# Patient Record
Sex: Female | Born: 1980 | Race: Black or African American | Hispanic: No | Marital: Married | Smoking: Former smoker
Health system: Southern US, Community
[De-identification: ages and names within clinical notes are randomized; demographics above are authoritative.]

## PROBLEM LIST (undated history)

## (undated) DIAGNOSIS — N979 Female infertility, unspecified: Secondary | ICD-10-CM

## (undated) DIAGNOSIS — N736 Female pelvic peritoneal adhesions (postinfective): Secondary | ICD-10-CM

## (undated) DIAGNOSIS — E7800 Pure hypercholesterolemia, unspecified: Secondary | ICD-10-CM

## (undated) DIAGNOSIS — Z8759 Personal history of other complications of pregnancy, childbirth and the puerperium: Secondary | ICD-10-CM

## (undated) DIAGNOSIS — D649 Anemia, unspecified: Secondary | ICD-10-CM

## (undated) DIAGNOSIS — N7011 Chronic salpingitis: Secondary | ICD-10-CM

## (undated) DIAGNOSIS — O09813 Supervision of pregnancy resulting from assisted reproductive technology, third trimester: Secondary | ICD-10-CM

## (undated) DIAGNOSIS — Z973 Presence of spectacles and contact lenses: Secondary | ICD-10-CM

## (undated) DIAGNOSIS — M549 Dorsalgia, unspecified: Secondary | ICD-10-CM

## (undated) HISTORY — DX: Pure hypercholesterolemia, unspecified: E78.00

## (undated) HISTORY — DX: Anemia, unspecified: D64.9

## (undated) HISTORY — DX: Dorsalgia, unspecified: M54.9

## (undated) HISTORY — DX: Female infertility, unspecified: N97.9

---

## 2011-01-22 ENCOUNTER — Emergency Department (HOSPITAL_COMMUNITY): Payer: No Typology Code available for payment source

## 2011-01-22 ENCOUNTER — Emergency Department (HOSPITAL_COMMUNITY)
Admission: EM | Admit: 2011-01-22 | Discharge: 2011-01-22 | Disposition: A | Discharge status: Home / Self Care | Payer: No Typology Code available for payment source | Attending: Emergency Medicine | Admitting: Emergency Medicine

## 2011-01-22 DIAGNOSIS — IMO0001 Reserved for inherently not codable concepts without codable children: Secondary | ICD-10-CM | POA: Insufficient documentation

## 2011-01-22 DIAGNOSIS — R079 Chest pain, unspecified: Secondary | ICD-10-CM | POA: Insufficient documentation

## 2012-06-12 ENCOUNTER — Emergency Department (HOSPITAL_COMMUNITY)
Admission: EM | Admit: 2012-06-12 | Discharge: 2012-06-12 | Disposition: A | Discharge status: Home / Self Care | Payer: No Typology Code available for payment source | Attending: Emergency Medicine | Admitting: Emergency Medicine

## 2012-06-12 ENCOUNTER — Encounter (HOSPITAL_COMMUNITY): Payer: Self-pay | Admitting: *Deleted

## 2012-06-12 DIAGNOSIS — T148XXA Other injury of unspecified body region, initial encounter: Secondary | ICD-10-CM

## 2012-06-12 DIAGNOSIS — Y92410 Unspecified street and highway as the place of occurrence of the external cause: Secondary | ICD-10-CM | POA: Insufficient documentation

## 2012-06-12 DIAGNOSIS — F17200 Nicotine dependence, unspecified, uncomplicated: Secondary | ICD-10-CM | POA: Insufficient documentation

## 2012-06-12 DIAGNOSIS — S335XXA Sprain of ligaments of lumbar spine, initial encounter: Secondary | ICD-10-CM | POA: Insufficient documentation

## 2012-06-12 MED ORDER — CYCLOBENZAPRINE HCL 10 MG PO TABS: 10.0000 mg | ORAL_TABLET | Freq: Three times a day (TID) | ORAL | Status: DC | PRN

## 2012-06-12 MED ORDER — HYDROCODONE-ACETAMINOPHEN 5-325 MG PO TABS
1.0000 | ORAL_TABLET | Freq: Once | ORAL | Status: AC
  Administered 2012-06-12: 1 via ORAL
  Filled 2012-06-12: qty 1

## 2012-06-12 MED ORDER — IBUPROFEN 800 MG PO TABS: 800.0000 mg | ORAL_TABLET | Freq: Three times a day (TID) | ORAL | Status: DC | PRN
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
## 2012-06-12 NOTE — ED Provider Notes (Signed)
Medical screening examination/treatment/procedure(s) were performed by non-physician practitioner and as supervising physician I was immediately available for consultation/collaboration.   Gwyneth Sprout, MD 06/12/12 2355
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
## 2015-09-10 DIAGNOSIS — O00202 Left ovarian pregnancy without intrauterine pregnancy: Secondary | ICD-10-CM | POA: Insufficient documentation

## 2015-09-20 ENCOUNTER — Emergency Department (HOSPITAL_COMMUNITY): Payer: BLUE CROSS/BLUE SHIELD | Admitting: Anesthesiology

## 2015-09-20 ENCOUNTER — Ambulatory Visit (HOSPITAL_COMMUNITY)
Admission: EM | Admit: 2015-09-20 | Discharge: 2015-09-20 | Disposition: A | Discharge status: Home / Self Care | Payer: BLUE CROSS/BLUE SHIELD | Source: Ambulatory Visit | Attending: Obstetrics & Gynecology | Admitting: Obstetrics & Gynecology

## 2015-09-20 ENCOUNTER — Encounter (HOSPITAL_COMMUNITY): Payer: Self-pay

## 2015-09-20 ENCOUNTER — Other Ambulatory Visit (HOSPITAL_COMMUNITY): Payer: Self-pay | Admitting: Obstetrics & Gynecology

## 2015-09-20 ENCOUNTER — Emergency Department (HOSPITAL_COMMUNITY): Payer: BLUE CROSS/BLUE SHIELD

## 2015-09-20 ENCOUNTER — Encounter (HOSPITAL_COMMUNITY)
Admission: EM | Disposition: A | Discharge status: Home / Self Care | Payer: Self-pay | Source: Ambulatory Visit | Attending: Emergency Medicine

## 2015-09-20 DIAGNOSIS — O001 Tubal pregnancy without intrauterine pregnancy: Secondary | ICD-10-CM | POA: Insufficient documentation

## 2015-09-20 DIAGNOSIS — O26899 Other specified pregnancy related conditions, unspecified trimester: Secondary | ICD-10-CM

## 2015-09-20 DIAGNOSIS — O009 Unspecified ectopic pregnancy without intrauterine pregnancy: Secondary | ICD-10-CM

## 2015-09-20 DIAGNOSIS — Z87891 Personal history of nicotine dependence: Secondary | ICD-10-CM | POA: Insufficient documentation

## 2015-09-20 DIAGNOSIS — R102 Pelvic and perineal pain: Secondary | ICD-10-CM

## 2015-09-20 HISTORY — PX: UNILATERAL SALPINGECTOMY: SHX6160

## 2015-09-20 HISTORY — PX: LAPAROSCOPY: SHX197

## 2015-09-20 LAB — HIV ANTIBODY (ROUTINE TESTING W REFLEX): HIV Screen 4th Generation wRfx: NONREACTIVE

## 2015-09-20 LAB — URINALYSIS, ROUTINE W REFLEX MICROSCOPIC
BILIRUBIN URINE: NEGATIVE
Glucose, UA: NEGATIVE mg/dL
HGB URINE DIPSTICK: NEGATIVE
Ketones, ur: 15 mg/dL — AB
Leukocytes, UA: NEGATIVE
Nitrite: NEGATIVE
PROTEIN: NEGATIVE mg/dL
Specific Gravity, Urine: 1.031 — ABNORMAL HIGH (ref 1.005–1.030)
pH: 6 (ref 5.0–8.0)

## 2015-09-20 LAB — HCG, QUANTITATIVE, PREGNANCY: HCG, BETA CHAIN, QUANT, S: 8088 m[IU]/mL — AB (ref <5)

## 2015-09-20 LAB — WET PREP, GENITAL
Clue Cells Wet Prep HPF POC: NONE SEEN
Sperm: NONE SEEN
Trich, Wet Prep: NONE SEEN
YEAST WET PREP: NONE SEEN

## 2015-09-20 LAB — TYPE AND SCREEN
ABO/RH(D): A POS
Antibody Screen: NEGATIVE

## 2015-09-20 LAB — CBC
HEMATOCRIT: 33.4 % — AB (ref 36.0–46.0)
HEMOGLOBIN: 11.2 g/dL — AB (ref 12.0–15.0)
MCH: 30.8 pg (ref 26.0–34.0)
MCHC: 33.5 g/dL (ref 30.0–36.0)
MCV: 91.8 fL (ref 78.0–100.0)
Platelets: 238 10*3/uL (ref 150–400)
RBC: 3.64 MIL/uL — AB (ref 3.87–5.11)
RDW: 12.5 % (ref 11.5–15.5)
WBC: 8.7 10*3/uL (ref 4.0–10.5)

## 2015-09-20 LAB — BASIC METABOLIC PANEL
Anion gap: 7 (ref 5–15)
BUN: 13 mg/dL (ref 6–20)
CHLORIDE: 106 mmol/L (ref 101–111)
CO2: 23 mmol/L (ref 22–32)
Calcium: 9.2 mg/dL (ref 8.9–10.3)
Creatinine, Ser: 0.79 mg/dL (ref 0.44–1.00)
GFR calc Af Amer: >60 mL/min (ref >60)
GFR calc non Af Amer: >60 mL/min (ref >60)
GLUCOSE: 100 mg/dL — AB (ref 65–99)
POTASSIUM: 3.9 mmol/L (ref 3.5–5.1)
Sodium: 136 mmol/L (ref 135–145)

## 2015-09-20 LAB — ABO/RH
ABO/RH(D): A POS
ABO/RH(D): A POS

## 2015-09-20 SURGERY — LAPAROSCOPY, DIAGNOSTIC
Anesthesia: General

## 2015-09-20 MED ORDER — SODIUM CHLORIDE 0.9 % IV BOLUS (SEPSIS)
1000.0000 mL | Freq: Once | INTRAVENOUS | Status: AC
  Administered 2015-09-20: 1000 mL via INTRAVENOUS

## 2015-09-20 MED ORDER — SCOPOLAMINE 1 MG/3DAYS TD PT72
MEDICATED_PATCH | TRANSDERMAL | Status: AC
  Filled 2015-09-20: qty 1

## 2015-09-20 MED ORDER — ACETAMINOPHEN 500 MG PO TABS
1000.0000 mg | ORAL_TABLET | Freq: Once | ORAL | Status: AC
  Administered 2015-09-20: 1000 mg via ORAL
  Filled 2015-09-20: qty 2

## 2015-09-20 MED ORDER — LIDOCAINE HCL (CARDIAC) 20 MG/ML IV SOLN
INTRAVENOUS | Status: DC | PRN
  Administered 2015-09-20: 100 mg via INTRAVENOUS
  Administered 2015-09-20: 400 mg via INTRAVENOUS

## 2015-09-20 MED ORDER — PHENYLEPHRINE HCL 10 MG/ML IJ SOLN
INTRAMUSCULAR | Status: DC | PRN
  Administered 2015-09-20 (×2): 40 ug via INTRAVENOUS
  Administered 2015-09-20: 120 ug via INTRAVENOUS

## 2015-09-20 MED ORDER — LACTATED RINGERS IV SOLN
INTRAVENOUS | Status: DC
  Administered 2015-09-20 (×2): via INTRAVENOUS

## 2015-09-20 MED ORDER — LIDOCAINE HCL (CARDIAC) 20 MG/ML IV SOLN
INTRAVENOUS | Status: AC
  Filled 2015-09-20: qty 5

## 2015-09-20 MED ORDER — ONDANSETRON HCL 4 MG/2ML IJ SOLN
INTRAMUSCULAR | Status: AC
  Filled 2015-09-20: qty 2

## 2015-09-20 MED ORDER — MIDAZOLAM HCL 2 MG/2ML IJ SOLN
INTRAMUSCULAR | Status: AC
  Filled 2015-09-20: qty 2

## 2015-09-20 MED ORDER — ONDANSETRON 4 MG PO TBDP
4.0000 mg | ORAL_TABLET | Freq: Once | ORAL | Status: AC
  Administered 2015-09-20: 4 mg via ORAL
  Filled 2015-09-20: qty 1

## 2015-09-20 MED ORDER — MORPHINE SULFATE (PF) 4 MG/ML IV SOLN: 4.0000 mg | Freq: Once | INTRAVENOUS | Status: DC

## 2015-09-20 MED ORDER — PHENYLEPHRINE 40 MCG/ML (10ML) SYRINGE FOR IV PUSH (FOR BLOOD PRESSURE SUPPORT)
PREFILLED_SYRINGE | INTRAVENOUS | Status: AC
  Filled 2015-09-20: qty 10

## 2015-09-20 MED ORDER — FENTANYL CITRATE (PF) 100 MCG/2ML IJ SOLN
INTRAMUSCULAR | Status: AC
  Filled 2015-09-20: qty 2

## 2015-09-20 MED ORDER — CEFAZOLIN SODIUM-DEXTROSE 2-3 GM-% IV SOLR
2.0000 g | INTRAVENOUS | Status: AC
  Administered 2015-09-20: 2 g via INTRAVENOUS

## 2015-09-20 MED ORDER — ONDANSETRON HCL 4 MG/2ML IJ SOLN: 4.0000 mg | Freq: Once | INTRAMUSCULAR | Status: DC | PRN

## 2015-09-20 MED ORDER — MIDAZOLAM HCL 2 MG/2ML IJ SOLN
INTRAMUSCULAR | Status: DC | PRN
Start: 2015-09-20 — End: 2015-09-20
  Administered 2015-09-20: 2 mg via INTRAVENOUS

## 2015-09-20 MED ORDER — MORPHINE SULFATE (PF) 4 MG/ML IV SOLN
4.0000 mg | Freq: Once | INTRAVENOUS | Status: AC
  Administered 2015-09-20: 4 mg via INTRAMUSCULAR
  Filled 2015-09-20: qty 1

## 2015-09-20 MED ORDER — BUPIVACAINE HCL (PF) 0.25 % IJ SOLN
INTRAMUSCULAR | Status: DC | PRN
  Administered 2015-09-20: 30 mL

## 2015-09-20 MED ORDER — DEXAMETHASONE SODIUM PHOSPHATE 10 MG/ML IJ SOLN
INTRAMUSCULAR | Status: AC
  Filled 2015-09-20: qty 1

## 2015-09-20 MED ORDER — FENTANYL CITRATE (PF) 100 MCG/2ML IJ SOLN
25.0000 ug | INTRAMUSCULAR | Status: DC | PRN
  Administered 2015-09-20: 50 ug via INTRAVENOUS

## 2015-09-20 MED ORDER — KETOROLAC TROMETHAMINE 30 MG/ML IJ SOLN: 30.0000 mg | Freq: Once | INTRAMUSCULAR | Status: DC

## 2015-09-20 MED ORDER — PROPOFOL 10 MG/ML IV BOLUS
INTRAVENOUS | Status: AC
  Filled 2015-09-20: qty 20

## 2015-09-20 MED ORDER — KETOROLAC TROMETHAMINE 30 MG/ML IJ SOLN
INTRAMUSCULAR | Status: DC | PRN
  Administered 2015-09-20: 30 mg via INTRAVENOUS

## 2015-09-20 MED ORDER — IBUPROFEN 600 MG PO TABS
600.0000 mg | ORAL_TABLET | Freq: Four times a day (QID) | ORAL | Status: DC | PRN
Start: 2015-09-20 — End: 2016-01-12

## 2015-09-20 MED ORDER — NEOSTIGMINE METHYLSULFATE 10 MG/10ML IV SOLN
INTRAVENOUS | Status: DC | PRN
  Administered 2015-09-20: 4 mg via INTRAVENOUS

## 2015-09-20 MED ORDER — GLYCOPYRROLATE 0.2 MG/ML IJ SOLN
INTRAMUSCULAR | Status: AC
  Filled 2015-09-20: qty 3

## 2015-09-20 MED ORDER — KETOROLAC TROMETHAMINE 30 MG/ML IJ SOLN
INTRAMUSCULAR | Status: AC
  Filled 2015-09-20: qty 1

## 2015-09-20 MED ORDER — LACTATED RINGERS IR SOLN
Status: DC | PRN
  Administered 2015-09-20: 3000 mL

## 2015-09-20 MED ORDER — NEOSTIGMINE METHYLSULFATE 10 MG/10ML IV SOLN
INTRAVENOUS | Status: AC
  Filled 2015-09-20: qty 1

## 2015-09-20 MED ORDER — OXYCODONE-ACETAMINOPHEN 5-325 MG PO TABS: 1.0000 | ORAL_TABLET | Freq: Four times a day (QID) | ORAL | Status: DC | PRN

## 2015-09-20 MED ORDER — PROPOFOL 10 MG/ML IV BOLUS
INTRAVENOUS | Status: DC | PRN
  Administered 2015-09-20: 180 mg via INTRAVENOUS

## 2015-09-20 MED ORDER — SCOPOLAMINE 1 MG/3DAYS TD PT72
1.0000 | MEDICATED_PATCH | Freq: Once | TRANSDERMAL | Status: DC
  Administered 2015-09-20: 1.5 mg via TRANSDERMAL

## 2015-09-20 MED ORDER — CEFAZOLIN SODIUM-DEXTROSE 2-3 GM-% IV SOLR
INTRAVENOUS | Status: AC
  Filled 2015-09-20: qty 50

## 2015-09-20 MED ORDER — GLYCOPYRROLATE 0.2 MG/ML IJ SOLN
INTRAMUSCULAR | Status: DC | PRN
  Administered 2015-09-20: 0.6 mg via INTRAVENOUS

## 2015-09-20 MED ORDER — MORPHINE SULFATE (PF) 4 MG/ML IV SOLN
4.0000 mg | Freq: Once | INTRAVENOUS | Status: AC
  Administered 2015-09-20: 4 mg via INTRAVENOUS
  Filled 2015-09-20: qty 1

## 2015-09-20 MED ORDER — FENTANYL CITRATE (PF) 250 MCG/5ML IJ SOLN
INTRAMUSCULAR | Status: AC
  Filled 2015-09-20: qty 5

## 2015-09-20 MED ORDER — FENTANYL CITRATE (PF) 100 MCG/2ML IJ SOLN
INTRAMUSCULAR | Status: DC | PRN
  Administered 2015-09-20 (×5): 50 ug via INTRAVENOUS

## 2015-09-20 MED ORDER — ROCURONIUM BROMIDE 100 MG/10ML IV SOLN
INTRAVENOUS | Status: DC | PRN
  Administered 2015-09-20: 40 mg via INTRAVENOUS

## 2015-09-20 MED ORDER — DEXAMETHASONE SODIUM PHOSPHATE 10 MG/ML IJ SOLN
INTRAMUSCULAR | Status: DC | PRN
  Administered 2015-09-20: 4 mg via INTRAVENOUS

## 2015-09-20 MED ORDER — MEPERIDINE HCL 25 MG/ML IJ SOLN: 6.2500 mg | INTRAMUSCULAR | Status: DC | PRN

## 2015-09-20 MED ORDER — HYDROCODONE-ACETAMINOPHEN 7.5-325 MG PO TABS: 1.0000 | ORAL_TABLET | Freq: Once | ORAL | Status: DC | PRN

## 2015-09-20 MED ORDER — ONDANSETRON HCL 4 MG/2ML IJ SOLN
INTRAMUSCULAR | Status: DC | PRN
  Administered 2015-09-20: 4 mg via INTRAVENOUS

## 2015-09-20 MED ORDER — BUPIVACAINE HCL (PF) 0.25 % IJ SOLN
INTRAMUSCULAR | Status: AC
  Filled 2015-09-20: qty 30

## 2015-09-20 SURGICAL SUPPLY — 34 items
CABLE HIGH FREQUENCY MONO STRZ (ELECTRODE) IMPLANT
CATH ROBINSON RED A/P 16FR (CATHETERS) IMPLANT
CHLORAPREP W/TINT 26ML (MISCELLANEOUS) ×4 IMPLANT
CLOTH BEACON ORANGE TIMEOUT ST (SAFETY) ×4 IMPLANT
DRSG COVADERM PLUS 2X2 (GAUZE/BANDAGES/DRESSINGS) ×8 IMPLANT
DRSG OPSITE POSTOP 3X4 (GAUZE/BANDAGES/DRESSINGS) ×4 IMPLANT
EVACUATOR SMOKE 8.L (FILTER) ×4 IMPLANT
FORCEPS CUTTING 33CM 5MM (CUTTING FORCEPS) IMPLANT
FORCEPS CUTTING 45CM 5MM (CUTTING FORCEPS) IMPLANT
GLOVE BIO SURGEON STRL SZ7 (GLOVE) ×4 IMPLANT
GLOVE BIOGEL PI IND STRL 7.0 (GLOVE) ×4 IMPLANT
GLOVE BIOGEL PI INDICATOR 7.0 (GLOVE) ×4
GOWN STRL REUS W/TWL LRG LVL3 (GOWN DISPOSABLE) ×12 IMPLANT
LIQUID BAND (GAUZE/BANDAGES/DRESSINGS) ×4 IMPLANT
MANIPULATOR UTERINE 4.5 ZUMI (MISCELLANEOUS) ×4 IMPLANT
NEEDLE INSUFFLATION 120MM (ENDOMECHANICALS) IMPLANT
NS IRRIG 1000ML POUR BTL (IV SOLUTION) ×4 IMPLANT
PACK LAPAROSCOPY BASIN (CUSTOM PROCEDURE TRAY) ×4 IMPLANT
PAD TRENDELENBURG POSITION (MISCELLANEOUS) ×4 IMPLANT
POUCH SPECIMEN RETRIEVAL 10MM (ENDOMECHANICALS) ×4 IMPLANT
PROTECTOR NERVE ULNAR (MISCELLANEOUS) ×4 IMPLANT
SCISSORS LAP 5X35 DISP (ENDOMECHANICALS) IMPLANT
SET IRRIG TUBING LAPAROSCOPIC (IRRIGATION / IRRIGATOR) ×4 IMPLANT
SLEEVE XCEL OPT CAN 5 100 (ENDOMECHANICALS) ×8 IMPLANT
SOLUTION ELECTROLUBE (MISCELLANEOUS) IMPLANT
SUT VICRYL 0 UR6 27IN ABS (SUTURE) ×4 IMPLANT
SUT VICRYL 4-0 PS2 18IN ABS (SUTURE) ×4 IMPLANT
TOWEL OR 17X24 6PK STRL BLUE (TOWEL DISPOSABLE) ×8 IMPLANT
TROCAR BALLN 12MMX100 BLUNT (TROCAR) ×4 IMPLANT
TROCAR OPTI TIP 5M 100M (ENDOMECHANICALS) ×4 IMPLANT
TROCAR XCEL NON-BLD 11X100MML (ENDOMECHANICALS) IMPLANT
TROCAR XCEL NON-BLD 5MMX100MML (ENDOMECHANICALS) ×4 IMPLANT
WARMER LAPAROSCOPE (MISCELLANEOUS) ×4 IMPLANT
WATER STERILE IRR 1000ML POUR (IV SOLUTION) IMPLANT

## 2015-09-20 NOTE — ED Notes (Addendum)
Pt 2 months pregnant and woke up this morning at 0400 cramping on the left lower side radiating around to the back. Also reports spotting off and on for about a week. Reports the pain is constant

## 2015-09-20 NOTE — ED Notes (Signed)
Report given to Nicole with [facility] ?

## 2015-09-20 NOTE — ED Notes (Signed)
Reports lower left quadrant pain with spotting for a week, "i think i'm 2 months pregnant". Previous pregnancy had no complications.

## 2015-09-20 NOTE — ED Notes (Signed)
Patient transported to Ultrasound 

## 2015-09-20 NOTE — Discharge Instructions (Addendum)
Ruptured Ectopic Pregnancy °An ectopic pregnancy is when the fertilized egg attaches (implants) outside the uterus. Most ectopic pregnancies occur in the fallopian tube. Rarely do ectopic pregnancies occur on the ovary, intestine, pelvis, or cervix. An ectopic pregnancy does not have the ability to develop into a normal, healthy baby.  °A ruptured ectopic pregnancy is one in which the fallopian tube gets torn or bursts and results in internal bleeding. Often there is intense abdominal pain, and sometimes, vaginal bleeding. Having an ectopic pregnancy can be a life-threatening experience. If left untreated, this dangerous condition can lead to a blood transfusion, abdominal surgery, or even death.  °CAUSES  °Damage to the fallopian tubes is the suspected cause in most ectopic pregnancies.  °RISK FACTORS °Depending on your circumstances, the amount of risk of having an ectopic pregnancy will vary. There are 3 categories that may help you identify whether you are potentially at risk. °High Risk °· You have gone through infertility treatment. °· You have had a previous ectopic pregnancy. °· You have had previous tubal surgery. °· You have had previous surgery to have the fallopian tubes tied (tubal ligation). °· You have tubal problems or diseases. °· You have been exposed to DES. DES is a medicine that was used until 1971 and had effects on babies whose mothers took the medicine. °· You become pregnant while using an intrauterine device (IUD) for birth control.  °Moderate Risk °· You have a history of infertility. °· You have a history of a sexually transmitted infection (STI). °· You have a history of pelvic inflammatory disease (PID). °· You have scarring from endometriosis. °· You have multiple sexual partners. °· You smoke.  °Low Risk °· You have had previous pelvic surgery. °· You use vaginal douching. °· You became sexually active before 35 years of age. °SYMPTOMS °An ectopic pregnancy should be suspected in  anyone who has missed a period and has abdominal pain or bleeding. °· You may experience normal pregnancy symptoms, such as: °¨ Nausea. °¨ Tiredness. °¨ Breast tenderness. °· Symptoms that are not normal include: °¨ Pain with intercourse. °¨ Irregular vaginal bleeding or spotting. °¨ Cramping or pain on one side, or in the lower abdomen. °¨ Fast heartbeat. °¨ Passing out while having a bowel movement. °· Symptoms of a ruptured ectopic pregnancy and internal bleeding may include: °¨ Sudden, severe pain in the abdomen and pelvis. °¨ Dizziness or fainting. °¨ Pain in the shoulder area. °DIAGNOSIS  °Tests that may be performed include: °· A pregnancy test. °· An ultrasound. °· Testing the specific level of pregnancy hormone in the bloodstream. °· Taking a sample of uterus tissue (dilation and curettage, D&C). °· Surgery to perform a visual exam of the inside of the abdomen using a lighted tube (laparoscopy). °TREATMENT  °Laparoscopic surgery or abdominal surgery is recommended for a ruptured ectopic pregnancy.  °· The whole fallopian tube may need to be removed (salpingectomy). °· If the tube is not too damaged, the tube may be saved, and the pregnancy will be surgically removed. In time, the tube may still function. °· If you have lost a lot of blood, you may need a blood transfusion. °· You may receive a Rho (D) immune globulin shot if you are Rh negative and the father is Rh positive, or if you do not know the Rh type of the father. This is to prevent problems with any future pregnancy. °SEEK IMMEDIATE MEDICAL CARE IF:  °You have any symptoms of an ectopic or ruptured ectopic pregnancy. This   is a medical emergency. MAKE SURE YOU:  Understand these instructions.  Will watch your condition.  Will get help right away if you are not doing well or get worse.   This information is not intended to replace advice given to you by your health care provider. Make sure you discuss any questions you have with your health  care provider.   Document Released: 08/16/2000 Document Revised: 08/24/2013 Document Reviewed: 05/31/2013 Elsevier Interactive Patient Education 2016 [employer]  DISCHARGE INSTRUCTIONS: Laparoscopy  The following instructions have been prepared to help you care for yourself upon your return home today.  Wound care:  Do not get the incision wet for the first 24 hours. The incision should be kept clean and dry.  The Band-Aids or dressings may be removed the day after surgery.  Should the incision become sore, red, and swollen after the first week, check with your doctor.  Personal hygiene:  Shower the day after your procedure.  Activity and limitations:  Do NOT drive or operate any equipment today.  Do NOT lift anything more than 15 pounds for 2-3 weeks after surgery.  Do NOT rest in bed all day.  Walking is encouraged. Walk each day, starting slowly with 5-minute walks 3 or 4 times a day. Slowly increase the length of your walks.  Walk up and down stairs slowly.  Do NOT do strenuous activities, such as golfing, playing tennis, bowling, running, biking, weight lifting, gardening, mowing, or vacuuming for 2-4 weeks. Ask your doctor when it is okay to start.  Diet: Eat a light meal as desired this evening. You may resume your usual diet tomorrow.  Return to work: This is dependent on the type of work you do. For the most part you can return to a desk job within a week of surgery. If you are more active at work, please discuss this with your doctor.  What to expect after your surgery: You may have a slight burning sensation when you urinate on the first day. You may have a very small amount of blood in the urine. Expect to have a small amount of vaginal discharge/light bleeding for 1-2 weeks. It is not unusual to have abdominal soreness and bruising for up to 2 weeks. You may be tired and need more rest for about 1 week. You may experience shoulder pain for 24-72 hours. Lying  flat in bed may relieve it.  NO IBUPROFEN PRODUCTS (MOTRIN, ADVIL) OR ALEVE UNTIL 7:20PM TODAY.  SCOPE PATCH MAY BE REMOVED ON OR BEFORE 09/23/15.   Call your doctor for any of the following:  Develop a fever of 100.4 or greater  Inability to urinate 6 hours after discharge from hospital  Severe pain not relieved by pain medications  Persistent of heavy bleeding at incision site  Redness or swelling around incision site after a week  Increasing nausea or vomiting  Patient Signature________________________________________ Nurse Signature_________________________________________

## 2015-09-20 NOTE — ED Provider Notes (Signed)
CSN: [id number]     Arrival date & time 09/20/15  0530 History   First MD Initiated Contact with Patient 09/20/15 0700     Chief Complaint  Patient presents with  . 2 months- cramping   . Vaginal Bleeding     (Consider location/radiation/quality/duration/timing/severity/associated sxs/prior Treatment) HPI  Pt presenting with c/o pain in left lower pelvic pain.  Pain started at 4am.  She has had some vaginal spotting over the past 5-7 days.  No dizziness or fainting.  No fever/chills.  She thinks her LMP was 12/6 and that she is about 2 months pregnant.  She has an appointment next week with OB/GYN.  She is G2P1, no complications with prior pregnancy.  Symptoms are constant.  No vomiting or change in stools associated. There are no other associated systemic symptoms, there are no other alleviating or modifying factors.   History reviewed. No pertinent past medical history. History reviewed. No pertinent past surgical history. History reviewed. No pertinent family history. Social History  Substance Use Topics  . Smoking status: Former Smoker -- 0.50 packs/day    Types: Cigarettes    Quit date: 09/05/2015  . Smokeless tobacco: None  . Alcohol Use: Yes     Comment: occasionally   OB History    Gravida Para Term Preterm AB TAB SAB Ectopic Multiple Living   0 0 0 0 0 0 1     Review of Systems  ROS reviewed and all otherwise negative except for mentioned in HPI    Allergies  Review of patient's allergies indicates no known allergies.  Home Medications   Prior to Admission medications   Medication Sig Start Date End Date Taking? Authorizing Provider  diphenhydrAMINE (BENADRYL) 25 MG tablet Take 50 mg by mouth every 6 (six) hours as needed for itching.   Yes Historical Provider, MD  Prenatal Vit-Fe Fumarate-FA (PRENATAL MULTIVITAMIN) TABS tablet Take 1 tablet by mouth daily at 12 noon.   Yes Historical Provider, MD  ibuprofen (ADVIL,MOTRIN) 600 MG tablet Take 1 tablet (600  mg total) by mouth every 6 (six) hours as needed. 09/20/15   Shea Evans, MD  oxyCODONE-acetaminophen (ROXICET) 5-325 MG tablet Take 1 tablet by mouth every 6 (six) hours as needed for severe pain. 09/20/15   Shea Evans, MD   BP 119/63 mmHg  Pulse 92  Temp(Src) 98.6 F (37 C) (Oral)  Resp 21  Ht  (1.676 m)  Wt 97.523 kg  BMI 34.72 kg/m2  SpO2 99%  Breastfeeding? Unknown  Vitals reviewed Physical Exam  Physical Examination: General appearance - alert, well appearing, and in no distress Mental status - alert, oriented to person, place, and time Eyes - no conjunctival injection no scleral icterus Mouth - mucous membranes moist, pharynx normal without lesions Chest - clear to auscultation, no wheezes, rales or rhonchi, symmetric air entry Heart - normal rate, regular rhythm, normal S1, S2, no murmurs, rubs, clicks or gallops Abdomen - soft, mild ttp in left lower abdomen no gaurding or rebound, nondistended, no masses or organomegaly Pelvic - normal external genitalia, vulva, cervical os closed with some bright red bleeding, no CMT, ttp in left adnexal region Neurological - alert, oriented, normal speech, Extremities - peripheral pulses normal, no pedal edema, no clubbing or cyanosis Skin - normal coloration and turgor, no rashes  ED Course  Procedures (including critical care time)  CRITICAL CARE Performed by: Ethelda Chick Total critical care time: 45 minutes Critical care time was exclusive of separately  billable procedures and treating other patients. Critical care was necessary to treat or prevent imminent or life-threatening deterioration. Critical care was time spent personally by me on the following activities: development of treatment plan with patient and/or surrogate as well as nursing, discussions with consultants, evaluation of patient's response to treatment, examination of patient, obtaining history from patient or surrogate, ordering and performing treatments  and interventions, ordering and review of laboratory studies, ordering and review of radiographic studies, pulse oximetry and re-evaluation of patient's condition. Labs Review Labs Reviewed  WET PREP, GENITAL - Abnormal; Notable for the following:    WBC, Wet Prep HPF POC MODERATE (*)    All other components within normal limits  HCG, QUANTITATIVE, PREGNANCY - Abnormal; Notable for the following:    hCG, Beta Chain, Quant, S 8088 (*)    All other components within normal limits  URINALYSIS, ROUTINE W REFLEX MICROSCOPIC (NOT AT [facility]) - Abnormal; Notable for the following:    Specific Gravity, Urine 1.031 (*)    Ketones, ur 15 (*)    All other components within normal limits  CBC - Abnormal; Notable for the following:    RBC 3.64 (*)    Hemoglobin 11.2 (*)    HCT 33.4 (*)    All other components within normal limits  BASIC METABOLIC PANEL - Abnormal; Notable for the following:    Glucose, Bld 100 (*)    All other components within normal limits  HIV ANTIBODY (ROUTINE TESTING)  ABO/RH  TYPE AND SCREEN  ABO/RH  GC/CHLAMYDIA PROBE AMP ([place]) NOT AT [facility]  SURGICAL PATHOLOGY    Imaging Review US Ob Comp Less 14 Wks  09/20/2015  CLINICAL DATA:  First trimester of pregnancy, acute left-sided pelvic pain. EXAM: OBSTETRIC <14 WK [place] AND TRANSVAGINAL OB US TECHNIQUE: Both transabdominal and transvaginal ultrasound examinations were performed for complete evaluation of the gestation as well as the maternal uterus, adnexal regions, and pelvic cul-de-sac. Transvaginal technique was performed to assess early pregnancy. COMPARISON:  Ultrasound of August 19, 2007. FINDINGS: Intrauterine gestational sac: No intrauterine fluid collection or gestational sac is noted. Yolk sac:  Visualized in left adnexal abnormality. Embryo:  Visualized in left adnexal abnormality. Cardiac Activity: Visualized in left adnexal abnormality. Heart Rate: 107  bpm CRL:  3.4  mm   6 w   0 d Subchorionic hemorrhage:  None  visualized. Maternal uterus/adnexae: Right ovary appears normal. Complex left adnexal mass measuring 4.9 x 2.6 x 2.4 cm is noted which contains fluid collection with yolk sac and embryo and cardiac activity consistent with ectopic pregnancy. Mild to moderate amount of fluid is seen in the left adnexal region which is somewhat complex suggesting possible hemorrhage. IMPRESSION: Complex left adnexal mass is noted which contains gestational sac with yolk sac and fetal pole with cardiac activity consistent with ectopic pregnancy. Mild to moderate fluid is noted in left adnexal region suggesting possible hemorrhage. Critical Value/emergent results were called by telephone at the time of interpretation on 09/20/2015 at 9:14 am to Dr. Jerelyn Scott , who verbally acknowledged these results. Electronically Signed   By: Lupita Raider, M.D.   On: 09/20/2015 09:14   US Ob Transvaginal  09/20/2015  CLINICAL DATA:  First trimester of pregnancy, acute left-sided pelvic pain. EXAM: OBSTETRIC <14 WK [place] AND TRANSVAGINAL OB US TECHNIQUE: Both transabdominal and transvaginal ultrasound examinations were performed for complete evaluation of the gestation as well as the maternal uterus, adnexal regions, and pelvic cul-de-sac. Transvaginal technique was performed to  assess early pregnancy. COMPARISON:  Ultrasound of August 19, 2007. FINDINGS: Intrauterine gestational sac: No intrauterine fluid collection or gestational sac is noted. Yolk sac:  Visualized in left adnexal abnormality. Embryo:  Visualized in left adnexal abnormality. Cardiac Activity: Visualized in left adnexal abnormality. Heart Rate: 107  bpm CRL:  3.4  mm   6 w   0 d Subchorionic hemorrhage:  None visualized. Maternal uterus/adnexae: Right ovary appears normal. Complex left adnexal mass measuring 4.9 x 2.6 x 2.4 cm is noted which contains fluid collection with yolk sac and embryo and cardiac activity consistent with ectopic pregnancy. Mild to moderate amount of  fluid is seen in the left adnexal region which is somewhat complex suggesting possible hemorrhage. IMPRESSION: Complex left adnexal mass is noted which contains gestational sac with yolk sac and fetal pole with cardiac activity consistent with ectopic pregnancy. Mild to moderate fluid is noted in left adnexal region suggesting possible hemorrhage. Critical Value/emergent results were called by telephone at the time of interpretation on 09/20/2015 at 9:14 am to Dr. Jerelyn Scott , who verbally acknowledged these results. Electronically Signed   By: Lupita Raider, M.D.   On: 09/20/2015 09:14   I have personally reviewed and evaluated these images and lab results as part of my medical decision-making.   EKG Interpretation None      MDM   Final diagnoses:  Pelvic pain affecting pregnancy  Ectopic pregnancy    Pt presenting in early pregnancy with abrupt onset of left pelvic pain.  Ultrasound shows ectopic in left adnexa, type and screen obtained, CBC, IV access. Pt with stable vital signs, her pain is improved after morphine in the ED.  Pt made NPO.    9:14 AM call from radiology, pt has ectopic pregnancy- will contact OB now.    10:10 AM awaiting call back from OB, I have called midlevel on call for [facility] as well- left a voicemail.  Secretary is Development worker, [facility] directly as well.  Dr. Juliene Pina is who patient idenitfies as her OB.  Her BP remains stable.    10:22 AM d/w Dr. Juliene Pina, pt to be transferred to [facility] for surgery.  Will contact [facility] for transport.    Jerelyn Scott, MD 09/20/15 1426

## 2015-09-20 NOTE — Op Note (Signed)
Preoperative diagnosis: Ruptured left adnexal ectopic pregnancy Postoperative diagnosis: Left fallopian tube ectopic pregnancy Procedure: Laparoscopic left salpingectomy  Surgeon: Dr Shea Evans, MD Assistants: Donette Larry, CNM Anesthesia Gen. Endotracheal IV fluids LR 800 cc EBL minimal Urine Foley: 300 cc Complications none Disposition PACU and home Specimens Left Fallopian tube with ectopic pregnancy  Procedure Patient is [redacted] weeks pregnant by last period. She presented to ED with acute left lower quadrant pain and pelvic sonogram noted left adnexal gestational sac with live pregnancy without any intrauterine pregnancy and pelvic fluid on left side, likely hemoperitoneum. Patient was counseled on laparoscopic surgery, possible salpingectomy since she was not a candidate for medical therapy.  Risk and complications of surgery including infection, bleeding, damage to internal organs, other complications including pneumonia, VTE were reviewed.  Also discussed difficulty pregnant in future if left with one tube that may be damaged too. Patient voiced understanding. Informed written consent was obtained. Patient was brought to the operating room with IV running. Timeout was carried out. She underwent general anesthesia without difficulty and was given dorsal lithotomy position. 2 gm Ancef given. parts prepped and draped in standard fashion. Bladder was emptied with foley.  Speculum was placed anterior lip of cervix was grasped with tenaculum, uterus was sounded to 8 cm and Zumi manipulator was introduced in the uterine cavity and secured with balloon.  Gloves were changed attention was focused on the abdomen. A 10 mm vertical incision was made at the umbilicus after injecting 10 cc Marcaine. Incision was carried down to the fascia was incised peritoneal entry was confirmed.  Hassan cannula with balloon was introduced. Insufflation was begun with CO2. Patient was given Trendelenburg position. A 0  laparoscope was introduced. Hemoperitoneum noted. Uterus normal. Left tubal ectopic with bleeding noted. Left tube and ovary were adherent and also noted adhesion from ovary to cul de sac.  Right tube was also appearing abnormal and fimbria were not seen, there was some hydrosalpinx. Right ovary appeared normal.  Two lower abdominal ports, 5 mm each were introduced under vision via 5 mm incisions after injecting marcaine. Graspers and Gyrus dissector was used to proceed with left salpingectomy and lysis of adhesions. Specimen was left in cul de sac, Hemostasis was excellent. Instruments removed.. Laparoscope was switched and 5 mm laparoscope was introduced from left lower port and endobag from central port and grasper from right lower port. Left fallopian tube with ectopic and clots were transferred into the endobag, which was closed under vision and removed from central incision. 10 mm scope switched, Irrigation suction performed and hemoperitoneum aspirated. Left salpingectomy site was hemostatic. All instruments removed. Pneumoperitoneum released. Umbilical fascial incision closed with 0-Vicryl and Skin was approximated using 3-0 Vicryl in subcuticular fashion. Dermabond was applied at the incision. The ZUMI manipulator was removed from the uterus, foley removed. Hemostasis was excellent. Instruments were removed.  All counts were correct x2 patient was reversed from general anesthesia extubated and brought out to the recovery room in stable condition. Plan is to discharge home from recovery room. Surgical findings were discussed with patient's family. Followup with Dr. Juliene Pina in office in 2 weeks.  V.Arrie Zuercher, MD

## 2015-09-20 NOTE — ED Notes (Signed)
Reported pain to [name], [name] and requested tylenol prior to exam. She acknowledges, gives verbal order for 1g of tylenol. Pelvic cart at the bedside.

## 2015-09-20 NOTE — ED Notes (Signed)
Pt departed with [facility], pt NAD, VSS, a x 4, husband at bedside, report called to [facility].  Dr Juliene Pina accepting.  Notified of pt departure.  All belongings with husband at bedside.

## 2015-09-20 NOTE — ED Notes (Signed)
MD at bedside. 

## 2015-09-20 NOTE — Anesthesia Procedure Notes (Signed)
Procedure Name: Intubation Date/Time: 09/20/2015 12:16 PM Performed by: Suella Grove Pre-anesthesia Checklist: Patient identified, Patient being monitored, Emergency Drugs available, Timeout performed and Suction available Patient Re-evaluated:Patient Re-evaluated prior to inductionOxygen Delivery Method: Circle system utilized and Simple face mask Preoxygenation: Pre-oxygenation with 100% oxygen Intubation Type: IV induction Ventilation: Mask ventilation without difficulty Laryngoscope Size: Mac and 3 Grade View: Grade II Tube size: 7.0 mm Number of attempts: 1 Airway Equipment and Method: Stylet Placement Confirmation: breath sounds checked- equal and bilateral and positive ETCO2 Secured at: 22 cm Tube secured with: Tape Dental Injury: Teeth and Oropharynx as per pre-operative assessment

## 2015-09-20 NOTE — Anesthesia Preprocedure Evaluation (Signed)
Anesthesia Evaluation  Patient identified by MRN, date of birth, ID band Patient awake    Reviewed: Allergy & Precautions, H&P , NPO status , Patient's Chart, lab work & pertinent test results  Airway Mallampati: I  TM Distance: >3 FB Neck ROM: full    Dental no notable dental hx. (+) Teeth Intact   Pulmonary former smoker,    Pulmonary exam normal        Cardiovascular negative cardio ROS Normal cardiovascular exam     Neuro/Psych negative neurological ROS  negative psych ROS   GI/Hepatic negative GI ROS, Neg liver ROS,   Endo/Other  negative endocrine ROS  Renal/GU negative Renal ROS     Musculoskeletal   Abdominal (+) + obese,   Peds  Hematology negative hematology ROS (+)   Anesthesia Other Findings   Reproductive/Obstetrics negative OB ROS                             Anesthesia Physical Anesthesia Plan  ASA: II  Anesthesia Plan: General   Post-op Pain Management:    Induction: Intravenous  Airway Management Planned: Oral ETT  Additional Equipment:   Intra-op Plan:   Post-operative Plan: Extubation in OR  Informed Consent: I have reviewed the patients History and Physical, chart, labs and discussed the procedure including the risks, benefits and alternatives for the proposed anesthesia with the patient or authorized representative who has indicated his/her understanding and acceptance.   Dental Advisory Given  Plan Discussed with: CRNA and Surgeon  Anesthesia Plan Comments:         Anesthesia Quick Evaluation

## 2015-09-20 NOTE — Transfer of Care (Signed)
Immediate Anesthesia Transfer of Care Note  Patient: Penny Horne  Procedure(s) Performed: Procedure(s): LAPAROSCOPY DIAGNOSTIC (N/A) UNILATERAL SALPINGECTOMY (Left)  Patient Location: PACU  Anesthesia Type:General  Level of Consciousness: awake, alert , oriented and patient cooperative  Airway & Oxygen Therapy: Patient Spontanous Breathing and Patient connected to nasal cannula oxygen  Post-op Assessment: Report given to RN and Post -op Vital signs reviewed and stable  Post vital signs: Reviewed and stable  Last Vitals:  Filed Vitals:   09/20/15 1044 09/20/15 1054  BP:  130/87  Pulse: 82 89  Temp:  36.9 C  Resp:  20    Complications: No apparent anesthesia complications

## 2015-09-20 NOTE — H&P (Signed)
Penny Horne is an 35 y.o. female G2P1001 with left ectopic pregnancy noted on sono in [facility] ED. LMP 08/08/15, positive preg test in office on 1/12 and was scheduled to have pelvic sono for viability next wk. She presented to [facility] ED with acute LLQ pain that started this morning at 4 am. Labs and vitals stable, so she was advised transfer to [facility] for surgery for ruptured ectopic pregnancy after I spoke with ED doctor.   History reviewed. No pertinent past medical history.  History reviewed. No pertinent past surgical history.  History reviewed. No pertinent family history.  Social History:  reports that she quit smoking about 2 weeks ago. Her smoking use included Cigarettes. She smoked 0.50 packs per day. She does not have any smokeless tobacco history on file. She reports that she drinks alcohol. She reports that she does not use illicit drugs.  Allergies: No Known Allergies  Prescriptions prior to admission  Medication Sig Dispense Refill Last Dose  . diphenhydrAMINE (BENADRYL) 25 MG tablet Take 50 mg by mouth every 6 (six) hours as needed for itching.   09/19/2015 at Unknown time  . Prenatal Vit-Fe Fumarate-FA (PRENATAL MULTIVITAMIN) TABS tablet Take 1 tablet by mouth daily at 12 noon.   Past Week at Unknown time    ROS  abdo pain, nausea  Blood pressure 130/87, pulse 89, temperature 98.4 F (36.9 C), temperature source Oral, resp. rate 20, height  (1.676 m), weight 215 lb (97.523 kg), SpO2 100 %, unknown if currently breastfeeding. Physical Exam   A&O x 3, no acute distress. Pleasant HEENT neg, no thyromegaly Lungs CTA bilat CV RRR, SS2 normal Abdo soft, non tender, non acute, mild tenderness but non surgical  Extr no edema/ tenderness Pelvic - deferred   Results for orders placed or performed during the hospital encounter of 09/20/15 (from the past 24 hour(s))  hCG, quantitative, pregnancy     Status: Abnormal   Collection Time: 09/20/15  5:59 AM  Result Value Ref  Range   hCG, Beta Chain, Quant, S 8088 (H) <5 mIU/mL  Urinalysis, Routine w reflex microscopic (not at [facility])     Status: Abnormal   Collection Time: 09/20/15  7:03 AM  Result Value Ref Range   Color, Urine YELLOW YELLOW   APPearance CLEAR CLEAR   Specific Gravity, Urine 1.031 (H) 1.005 - 1.030   pH 6.0 5.0 - 8.0   Glucose, UA NEGATIVE NEGATIVE mg/dL   Hgb urine dipstick NEGATIVE NEGATIVE   Bilirubin Urine NEGATIVE NEGATIVE   Ketones, ur 15 (A) NEGATIVE mg/dL   Protein, ur NEGATIVE NEGATIVE mg/dL   Nitrite NEGATIVE NEGATIVE   Leukocytes, UA NEGATIVE NEGATIVE  ABO/Rh     Status: None   Collection Time: 09/20/15  8:05 AM  Result Value Ref Range   ABO/RH(D) A POS    No rh immune globuloin NOT A RH IMMUNE GLOBULIN CANDIDATE, PT RH POSITIVE   Wet prep, genital     Status: Abnormal   Collection Time: 09/20/15  9:16 AM  Result Value Ref Range   Yeast Wet Prep HPF POC NONE SEEN NONE SEEN   Trich, Wet Prep NONE SEEN NONE SEEN   Clue Cells Wet Prep HPF POC NONE SEEN NONE SEEN   WBC, Wet Prep HPF POC MODERATE (A) NONE SEEN   Sperm NONE SEEN   CBC     Status: Abnormal   Collection Time: 09/20/15  9:30 AM  Result Value Ref Range   WBC 8.7 4.0 - 10.5  K/uL   RBC 3.64 (L) 3.87 - 5.11 MIL/uL   Hemoglobin 11.2 (L) 12.0 - 15.0 g/dL   HCT 16.1 (L) 09.6 - 04.5 %   MCV 91.8 78.0 - 100.0 fL   MCH 30.8 26.0 - 34.0 pg   MCHC 33.5 30.0 - 36.0 g/dL   RDW 40.9 81.1 - 91.4 %   Platelets 238 150 - 400 K/uL  Basic metabolic panel     Status: Abnormal   Collection Time: 09/20/15  9:30 AM  Result Value Ref Range   Sodium 136 135 - 145 mmol/L   Potassium 3.9 3.5 - 5.1 mmol/L   Chloride 106 101 - 111 mmol/L   CO2 23 22 - 32 mmol/L   Glucose, Bld 100 (H) 65 - 99 mg/dL   BUN 13 6 - 20 mg/dL   Creatinine, Ser 7.82 0.44 - 1.00 mg/dL   Calcium 9.2 8.9 - 95.6 mg/dL   GFR calc non Af Amer >60 >60 mL/min   GFR calc Af Amer >60 >60 mL/min   Anion gap 7 5 - 15    US Ob Comp Less 14 Wks  09/20/2015   CLINICAL DATA:  First trimester of pregnancy, acute left-sided pelvic pain. EXAM: OBSTETRIC <14 WK [place] AND TRANSVAGINAL OB US TECHNIQUE: Both transabdominal and transvaginal ultrasound examinations were performed for complete evaluation of the gestation as well as the maternal uterus, adnexal regions, and pelvic cul-de-sac. Transvaginal technique was performed to assess early pregnancy. COMPARISON:  Ultrasound of August 19, 2007. FINDINGS: Intrauterine gestational sac: No intrauterine fluid collection or gestational sac is noted. Yolk sac:  Visualized in left adnexal abnormality. Embryo:  Visualized in left adnexal abnormality. Cardiac Activity: Visualized in left adnexal abnormality. Heart Rate: 107  bpm CRL:  3.4  mm   6 w   0 d Subchorionic hemorrhage:  None visualized. Maternal uterus/adnexae: Right ovary appears normal. Complex left adnexal mass measuring 4.9 x 2.6 x 2.4 cm is noted which contains fluid collection with yolk sac and embryo and cardiac activity consistent with ectopic pregnancy. Mild to moderate amount of fluid is seen in the left adnexal region which is somewhat complex suggesting possible hemorrhage. IMPRESSION: Complex left adnexal mass is noted which contains gestational sac with yolk sac and fetal pole with cardiac activity consistent with ectopic pregnancy. Mild to moderate fluid is noted in left adnexal region suggesting possible hemorrhage. Critical Value/emergent results were called by telephone at the time of interpretation on 09/20/2015 at 9:14 am to Dr. Jerelyn Scott , who verbally acknowledged these results. Electronically Signed   By: Lupita Raider, M.D.   On: 09/20/2015 09:14   US Ob Transvaginal  09/20/2015  CLINICAL DATA:  First trimester of pregnancy, acute left-sided pelvic pain. EXAM: OBSTETRIC <14 WK [place] AND TRANSVAGINAL OB US TECHNIQUE: Both transabdominal and transvaginal ultrasound examinations were performed for complete evaluation of the gestation as well as the  maternal uterus, adnexal regions, and pelvic cul-de-sac. Transvaginal technique was performed to assess early pregnancy. COMPARISON:  Ultrasound of August 19, 2007. FINDINGS: Intrauterine gestational sac: No intrauterine fluid collection or gestational sac is noted. Yolk sac:  Visualized in left adnexal abnormality. Embryo:  Visualized in left adnexal abnormality. Cardiac Activity: Visualized in left adnexal abnormality. Heart Rate: 107  bpm CRL:  3.4  mm   6 w   0 d Subchorionic hemorrhage:  None visualized. Maternal uterus/adnexae: Right ovary appears normal. Complex left adnexal mass measuring 4.9 x 2.6 x 2.4 cm is noted which contains  fluid collection with yolk sac and embryo and cardiac activity consistent with ectopic pregnancy. Mild to moderate amount of fluid is seen in the left adnexal region which is somewhat complex suggesting possible hemorrhage. IMPRESSION: Complex left adnexal mass is noted which contains gestational sac with yolk sac and fetal pole with cardiac activity consistent with ectopic pregnancy. Mild to moderate fluid is noted in left adnexal region suggesting possible hemorrhage. Critical Value/emergent results were called by telephone at the time of interpretation on 09/20/2015 at 9:14 am to Dr. Jerelyn Scott , who verbally acknowledged these results. Electronically Signed   By: Lupita Raider, M.D.   On: 09/20/2015 09:14    Assessment/Plan: Left adnexal ruptured ectopic pregnancy. Proceed with Laparoscopy, possible salpingectomy. Risks/complications of surgery reviewed incl infection, bleeding, damage to internal organs including bladder, bowels, ureters, blood vessels, other risks from anesthesia, VTE and delayed complications of any surgery, complications in future surgery reviewed.   Shellia Hartl R 09/20/2015, 11:51 AM

## 2015-09-21 ENCOUNTER — Encounter (HOSPITAL_COMMUNITY): Payer: Self-pay | Admitting: Obstetrics & Gynecology

## 2015-09-21 LAB — GC/CHLAMYDIA PROBE AMP (~~LOC~~) NOT AT ARMC
CHLAMYDIA, DNA PROBE: NEGATIVE
Neisseria Gonorrhea: NEGATIVE

## 2015-09-21 NOTE — Anesthesia Postprocedure Evaluation (Signed)
Anesthesia Post Note  Patient: Comptroller  Procedure(s) Performed: Procedure(s) (LRB): LAPAROSCOPY DIAGNOSTIC (N/A) UNILATERAL SALPINGECTOMY (Left)  Patient location during evaluation: PACU Anesthesia Type: General Level of consciousness: awake Pain management: pain level controlled Vital Signs Assessment: post-procedure vital signs reviewed and stable Respiratory status: spontaneous breathing Cardiovascular status: stable Postop Assessment: no signs of nausea or vomiting Anesthetic complications: no    Last Vitals:  Filed Vitals:   09/20/15 1545 09/20/15 1641  BP:  116/58  Pulse:  76  Temp:    Resp: 18 20    Last Pain:  Filed Vitals:   09/20/15 1642  PainSc: 1                  Jenesis Martin JR,JOHN Montague Corella

## 2015-12-11 DIAGNOSIS — Z319 Encounter for procreative management, unspecified: Secondary | ICD-10-CM | POA: Diagnosis not present

## 2015-12-11 DIAGNOSIS — N971 Female infertility of tubal origin: Secondary | ICD-10-CM | POA: Diagnosis not present

## 2015-12-11 DIAGNOSIS — N7011 Chronic salpingitis: Secondary | ICD-10-CM | POA: Diagnosis not present

## 2015-12-12 DIAGNOSIS — Z130 Encounter for screening for diseases of the blood and blood-forming organs and certain disorders involving the immune mechanism: Secondary | ICD-10-CM | POA: Diagnosis not present

## 2015-12-12 DIAGNOSIS — Z13228 Encounter for screening for other metabolic disorders: Secondary | ICD-10-CM | POA: Diagnosis not present

## 2015-12-12 DIAGNOSIS — Z31430 Encounter of female for testing for genetic disease carrier status for procreative management: Secondary | ICD-10-CM | POA: Diagnosis not present

## 2015-12-15 DIAGNOSIS — Z319 Encounter for procreative management, unspecified: Secondary | ICD-10-CM | POA: Diagnosis not present

## 2015-12-25 DIAGNOSIS — N7011 Chronic salpingitis: Secondary | ICD-10-CM | POA: Diagnosis not present

## 2015-12-25 DIAGNOSIS — Z319 Encounter for procreative management, unspecified: Secondary | ICD-10-CM | POA: Diagnosis not present

## 2015-12-25 DIAGNOSIS — Z3141 Encounter for fertility testing: Secondary | ICD-10-CM | POA: Diagnosis not present

## 2016-01-12 ENCOUNTER — Encounter (HOSPITAL_BASED_OUTPATIENT_CLINIC_OR_DEPARTMENT_OTHER): Payer: Self-pay | Admitting: *Deleted

## 2016-01-12 NOTE — Progress Notes (Signed)
NPO AFTER MN.  ARRIVE AT 1045.  NEEDS HG AND URINE PREG. PRE-OP ORDERS PENDING.

## 2016-01-23 ENCOUNTER — Ambulatory Visit (HOSPITAL_BASED_OUTPATIENT_CLINIC_OR_DEPARTMENT_OTHER)
Admission: RE | Admit: 2016-01-23 | Payer: BLUE CROSS/BLUE SHIELD | Source: Ambulatory Visit | Admitting: Obstetrics and Gynecology

## 2016-01-23 HISTORY — DX: Personal history of other complications of pregnancy, childbirth and the puerperium: Z87.59

## 2016-01-23 HISTORY — DX: Chronic salpingitis: N70.11

## 2016-01-23 HISTORY — DX: Presence of spectacles and contact lenses: Z97.3

## 2016-01-23 HISTORY — DX: Female pelvic peritoneal adhesions (postinfective): N73.6

## 2016-01-23 SURGERY — LAPAROSCOPY OPERATIVE
Anesthesia: General | Laterality: Right

## 2016-09-24 DIAGNOSIS — Z1329 Encounter for screening for other suspected endocrine disorder: Secondary | ICD-10-CM | POA: Diagnosis not present

## 2016-09-24 DIAGNOSIS — Z130 Encounter for screening for diseases of the blood and blood-forming organs and certain disorders involving the immune mechanism: Secondary | ICD-10-CM | POA: Diagnosis not present

## 2016-09-24 DIAGNOSIS — Z01419 Encounter for gynecological examination (general) (routine) without abnormal findings: Secondary | ICD-10-CM | POA: Diagnosis not present

## 2016-09-24 DIAGNOSIS — Z13220 Encounter for screening for lipoid disorders: Secondary | ICD-10-CM | POA: Diagnosis not present

## 2016-09-24 DIAGNOSIS — Z6837 Body mass index (BMI) 37.0-37.9, adult: Secondary | ICD-10-CM | POA: Diagnosis not present

## 2016-09-24 DIAGNOSIS — Z131 Encounter for screening for diabetes mellitus: Secondary | ICD-10-CM | POA: Diagnosis not present

## 2016-09-24 DIAGNOSIS — Z0000 Encounter for general adult medical examination without abnormal findings: Secondary | ICD-10-CM | POA: Diagnosis not present

## 2016-10-21 DIAGNOSIS — N7011 Chronic salpingitis: Secondary | ICD-10-CM | POA: Diagnosis not present

## 2016-10-21 DIAGNOSIS — Z319 Encounter for procreative management, unspecified: Secondary | ICD-10-CM | POA: Diagnosis not present

## 2016-10-21 DIAGNOSIS — N971 Female infertility of tubal origin: Secondary | ICD-10-CM | POA: Diagnosis not present

## 2016-11-21 DIAGNOSIS — M659 Synovitis and tenosynovitis, unspecified: Secondary | ICD-10-CM | POA: Diagnosis not present

## 2016-11-21 DIAGNOSIS — M205X1 Other deformities of toe(s) (acquired), right foot: Secondary | ICD-10-CM | POA: Diagnosis not present

## 2016-11-21 DIAGNOSIS — M19072 Primary osteoarthritis, left ankle and foot: Secondary | ICD-10-CM | POA: Diagnosis not present

## 2016-11-21 DIAGNOSIS — M19071 Primary osteoarthritis, right ankle and foot: Secondary | ICD-10-CM | POA: Diagnosis not present

## 2016-11-21 DIAGNOSIS — M205X2 Other deformities of toe(s) (acquired), left foot: Secondary | ICD-10-CM | POA: Diagnosis not present

## 2016-11-21 DIAGNOSIS — M7752 Other enthesopathy of left foot: Secondary | ICD-10-CM | POA: Diagnosis not present

## 2016-11-21 DIAGNOSIS — M7751 Other enthesopathy of right foot: Secondary | ICD-10-CM | POA: Diagnosis not present

## 2016-12-19 DIAGNOSIS — M7751 Other enthesopathy of right foot: Secondary | ICD-10-CM | POA: Diagnosis not present

## 2016-12-19 DIAGNOSIS — M7752 Other enthesopathy of left foot: Secondary | ICD-10-CM | POA: Diagnosis not present

## 2016-12-19 DIAGNOSIS — M205X9 Other deformities of toe(s) (acquired), unspecified foot: Secondary | ICD-10-CM | POA: Diagnosis not present

## 2016-12-30 DIAGNOSIS — N7011 Chronic salpingitis: Secondary | ICD-10-CM | POA: Diagnosis not present

## 2016-12-30 DIAGNOSIS — Z319 Encounter for procreative management, unspecified: Secondary | ICD-10-CM | POA: Diagnosis not present

## 2016-12-30 DIAGNOSIS — Z113 Encounter for screening for infections with a predominantly sexual mode of transmission: Secondary | ICD-10-CM | POA: Diagnosis not present

## 2016-12-30 DIAGNOSIS — N8500 Endometrial hyperplasia, unspecified: Secondary | ICD-10-CM | POA: Diagnosis not present

## 2017-01-08 DIAGNOSIS — N7011 Chronic salpingitis: Secondary | ICD-10-CM | POA: Diagnosis not present

## 2017-01-08 DIAGNOSIS — N971 Female infertility of tubal origin: Secondary | ICD-10-CM | POA: Diagnosis not present

## 2017-01-13 DIAGNOSIS — Z3183 Encounter for assisted reproductive fertility procedure cycle: Secondary | ICD-10-CM | POA: Diagnosis not present

## 2017-01-16 DIAGNOSIS — Z3183 Encounter for assisted reproductive fertility procedure cycle: Secondary | ICD-10-CM | POA: Diagnosis not present

## 2017-01-17 DIAGNOSIS — Z3183 Encounter for assisted reproductive fertility procedure cycle: Secondary | ICD-10-CM | POA: Diagnosis not present

## 2017-01-20 DIAGNOSIS — Z3183 Encounter for assisted reproductive fertility procedure cycle: Secondary | ICD-10-CM | POA: Diagnosis not present

## 2017-01-21 DIAGNOSIS — Z3183 Encounter for assisted reproductive fertility procedure cycle: Secondary | ICD-10-CM | POA: Diagnosis not present

## 2017-01-22 DIAGNOSIS — Z3183 Encounter for assisted reproductive fertility procedure cycle: Secondary | ICD-10-CM | POA: Diagnosis not present

## 2017-01-23 DIAGNOSIS — M67472 Ganglion, left ankle and foot: Secondary | ICD-10-CM | POA: Diagnosis not present

## 2017-01-23 DIAGNOSIS — M7752 Other enthesopathy of left foot: Secondary | ICD-10-CM | POA: Diagnosis not present

## 2017-01-24 DIAGNOSIS — Z3141 Encounter for fertility testing: Secondary | ICD-10-CM | POA: Diagnosis not present

## 2017-01-24 DIAGNOSIS — Z3183 Encounter for assisted reproductive fertility procedure cycle: Secondary | ICD-10-CM | POA: Diagnosis not present

## 2017-01-24 DIAGNOSIS — N8500 Endometrial hyperplasia, unspecified: Secondary | ICD-10-CM | POA: Diagnosis not present

## 2017-01-29 DIAGNOSIS — Z3183 Encounter for assisted reproductive fertility procedure cycle: Secondary | ICD-10-CM | POA: Diagnosis not present

## 2017-03-11 ENCOUNTER — Encounter (HOSPITAL_BASED_OUTPATIENT_CLINIC_OR_DEPARTMENT_OTHER): Payer: Self-pay | Admitting: *Deleted

## 2017-03-11 NOTE — Progress Notes (Signed)
Pt instructed npo pmn 7/23 x claritin if needed w sip of water.  [facility]. Needs hgb, urine hcg on arrival.

## 2017-03-25 ENCOUNTER — Ambulatory Visit (HOSPITAL_BASED_OUTPATIENT_CLINIC_OR_DEPARTMENT_OTHER)
Admission: RE | Admit: 2017-03-25 | Discharge: 2017-03-25 | Disposition: A | Discharge status: Home / Self Care | Payer: BLUE CROSS/BLUE SHIELD | Source: Ambulatory Visit | Attending: Obstetrics and Gynecology | Admitting: Obstetrics and Gynecology

## 2017-03-25 ENCOUNTER — Encounter (HOSPITAL_BASED_OUTPATIENT_CLINIC_OR_DEPARTMENT_OTHER): Payer: Self-pay | Admitting: Anesthesiology

## 2017-03-25 ENCOUNTER — Ambulatory Visit (HOSPITAL_BASED_OUTPATIENT_CLINIC_OR_DEPARTMENT_OTHER): Payer: BLUE CROSS/BLUE SHIELD | Admitting: Anesthesiology

## 2017-03-25 ENCOUNTER — Encounter (HOSPITAL_BASED_OUTPATIENT_CLINIC_OR_DEPARTMENT_OTHER)
Admission: RE | Disposition: A | Discharge status: Home / Self Care | Payer: Self-pay | Source: Ambulatory Visit | Attending: Obstetrics and Gynecology

## 2017-03-25 DIAGNOSIS — Z6838 Body mass index (BMI) 38.0-38.9, adult: Secondary | ICD-10-CM | POA: Diagnosis not present

## 2017-03-25 DIAGNOSIS — Z87891 Personal history of nicotine dependence: Secondary | ICD-10-CM | POA: Diagnosis not present

## 2017-03-25 DIAGNOSIS — N979 Female infertility, unspecified: Secondary | ICD-10-CM | POA: Insufficient documentation

## 2017-03-25 DIAGNOSIS — N7011 Chronic salpingitis: Secondary | ICD-10-CM | POA: Insufficient documentation

## 2017-03-25 DIAGNOSIS — N736 Female pelvic peritoneal adhesions (postinfective): Secondary | ICD-10-CM | POA: Diagnosis not present

## 2017-03-25 DIAGNOSIS — N858 Other specified noninflammatory disorders of uterus: Secondary | ICD-10-CM | POA: Diagnosis not present

## 2017-03-25 HISTORY — PX: LAPAROSCOPY: SHX197

## 2017-03-25 HISTORY — PX: LYSIS OF ADHESION: SHX5961

## 2017-03-25 LAB — POCT PREGNANCY, URINE: Preg Test, Ur: NEGATIVE

## 2017-03-25 LAB — POCT HEMOGLOBIN-HEMACUE
HEMOGLOBIN: 10.7 g/dL — AB (ref 12.0–15.0)
Hemoglobin: 10.5 g/dL — ABNORMAL LOW (ref 12.0–15.0)

## 2017-03-25 SURGERY — LAPAROSCOPY OPERATIVE
Anesthesia: General | Site: Abdomen

## 2017-03-25 MED ORDER — PROPOFOL 10 MG/ML IV BOLUS
INTRAVENOUS | Status: AC
  Filled 2017-03-25: qty 40

## 2017-03-25 MED ORDER — DEXAMETHASONE SODIUM PHOSPHATE 10 MG/ML IJ SOLN
INTRAMUSCULAR | Status: DC | PRN
  Administered 2017-03-25: 10 mg via INTRAVENOUS

## 2017-03-25 MED ORDER — BUPIVACAINE-EPINEPHRINE 0.25% -1:200000 IJ SOLN
INTRAMUSCULAR | Status: DC | PRN
  Administered 2017-03-25: 7 mL

## 2017-03-25 MED ORDER — LACTATED RINGERS IR SOLN
Status: DC | PRN
  Administered 2017-03-25: 3000 mL

## 2017-03-25 MED ORDER — MIDAZOLAM HCL 5 MG/5ML IJ SOLN
INTRAMUSCULAR | Status: DC | PRN
  Administered 2017-03-25: 2 mg via INTRAVENOUS

## 2017-03-25 MED ORDER — ACETAMINOPHEN 325 MG PO TABS
325.0000 mg | ORAL_TABLET | ORAL | Status: DC | PRN
  Filled 2017-03-25: qty 2

## 2017-03-25 MED ORDER — CEFAZOLIN SODIUM-DEXTROSE 2-4 GM/100ML-% IV SOLN
2.0000 g | INTRAVENOUS | Status: AC
  Administered 2017-03-25: 2 g via INTRAVENOUS
  Filled 2017-03-25: qty 100

## 2017-03-25 MED ORDER — METHYLENE BLUE 0.5 % INJ SOLN
INTRAVENOUS | Status: DC | PRN
  Administered 2017-03-25: 1 mL via SUBMUCOSAL

## 2017-03-25 MED ORDER — LACTATED RINGERS IV SOLN
INTRAVENOUS | Status: DC
  Administered 2017-03-25: 08:00:00 via INTRAVENOUS
  Filled 2017-03-25: qty 1000

## 2017-03-25 MED ORDER — ROCURONIUM BROMIDE 50 MG/5ML IV SOSY
PREFILLED_SYRINGE | INTRAVENOUS | Status: AC
  Filled 2017-03-25: qty 5

## 2017-03-25 MED ORDER — FENTANYL CITRATE (PF) 250 MCG/5ML IJ SOLN
INTRAMUSCULAR | Status: AC
  Filled 2017-03-25: qty 5

## 2017-03-25 MED ORDER — OXYCODONE-ACETAMINOPHEN 7.5-325 MG PO TABS: 1.0000 | ORAL_TABLET | ORAL | 0 refills | Status: DC | PRN

## 2017-03-25 MED ORDER — FENTANYL CITRATE (PF) 100 MCG/2ML IJ SOLN
INTRAMUSCULAR | Status: DC | PRN
  Administered 2017-03-25: 100 ug via INTRAVENOUS
  Administered 2017-03-25: 50 ug via INTRAVENOUS

## 2017-03-25 MED ORDER — OXYCODONE HCL 5 MG PO TABS
5.0000 mg | ORAL_TABLET | Freq: Once | ORAL | Status: DC | PRN
  Filled 2017-03-25: qty 1

## 2017-03-25 MED ORDER — OXYCODONE HCL 5 MG/5ML PO SOLN
5.0000 mg | Freq: Once | ORAL | Status: DC | PRN
  Filled 2017-03-25: qty 5

## 2017-03-25 MED ORDER — CEFAZOLIN SODIUM-DEXTROSE 2-4 GM/100ML-% IV SOLN
INTRAVENOUS | Status: AC
  Filled 2017-03-25: qty 100

## 2017-03-25 MED ORDER — ONDANSETRON HCL 4 MG PO TABS: 4.0000 mg | ORAL_TABLET | Freq: Three times a day (TID) | ORAL | 0 refills | Status: DC | PRN

## 2017-03-25 MED ORDER — MIDAZOLAM HCL 2 MG/2ML IJ SOLN
INTRAMUSCULAR | Status: AC
  Filled 2017-03-25: qty 2

## 2017-03-25 MED ORDER — KETOROLAC TROMETHAMINE 30 MG/ML IJ SOLN
INTRAMUSCULAR | Status: DC | PRN
  Administered 2017-03-25: 30 mg via INTRAVENOUS

## 2017-03-25 MED ORDER — LIDOCAINE 2% (20 MG/ML) 5 ML SYRINGE
INTRAMUSCULAR | Status: DC | PRN
  Administered 2017-03-25: 100 mg via INTRAVENOUS

## 2017-03-25 MED ORDER — ACETAMINOPHEN 160 MG/5ML PO SOLN
325.0000 mg | ORAL | Status: DC | PRN
  Filled 2017-03-25: qty 20.3

## 2017-03-25 MED ORDER — ROCURONIUM BROMIDE 50 MG/5ML IV SOSY
PREFILLED_SYRINGE | INTRAVENOUS | Status: DC | PRN
  Administered 2017-03-25: 50 mg via INTRAVENOUS

## 2017-03-25 MED ORDER — LIDOCAINE 2% (20 MG/ML) 5 ML SYRINGE
INTRAMUSCULAR | Status: AC
  Filled 2017-03-25: qty 5

## 2017-03-25 MED ORDER — FENTANYL CITRATE (PF) 100 MCG/2ML IJ SOLN
25.0000 ug | INTRAMUSCULAR | Status: DC | PRN
  Filled 2017-03-25: qty 1

## 2017-03-25 MED ORDER — ONDANSETRON HCL 4 MG/2ML IJ SOLN
INTRAMUSCULAR | Status: DC | PRN
  Administered 2017-03-25: 4 mg via INTRAVENOUS

## 2017-03-25 MED ORDER — PROPOFOL 10 MG/ML IV BOLUS
INTRAVENOUS | Status: DC | PRN
  Administered 2017-03-25: 200 mg via INTRAVENOUS

## 2017-03-25 MED ORDER — SUGAMMADEX SODIUM 200 MG/2ML IV SOLN
INTRAVENOUS | Status: DC | PRN
  Administered 2017-03-25: 220 mg via INTRAVENOUS

## 2017-03-25 MED ORDER — DEXAMETHASONE SODIUM PHOSPHATE 10 MG/ML IJ SOLN
INTRAMUSCULAR | Status: AC
  Filled 2017-03-25: qty 1

## 2017-03-25 MED ORDER — KETOROLAC TROMETHAMINE 30 MG/ML IJ SOLN
INTRAMUSCULAR | Status: AC
  Filled 2017-03-25: qty 2

## 2017-03-25 MED ORDER — ONDANSETRON HCL 4 MG/2ML IJ SOLN
INTRAMUSCULAR | Status: AC
  Filled 2017-03-25: qty 2

## 2017-03-25 SURGICAL SUPPLY — 69 items
BLADE SURG 11 STRL SS (BLADE) ×3 IMPLANT
CABLE HIGH FREQUENCY MONO STRZ (ELECTRODE) IMPLANT
CATH ROBINSON RED A/P 16FR (CATHETERS) IMPLANT
CLOTH BEACON ORANGE TIMEOUT ST (SAFETY) ×3 IMPLANT
CONT SPECI 4OZ STER CLIK (MISCELLANEOUS) IMPLANT
COVER MAYO STAND STRL (DRAPES) ×3 IMPLANT
DERMABOND ADVANCED (GAUZE/BANDAGES/DRESSINGS)
DERMABOND ADVANCED .7 DNX12 (GAUZE/BANDAGES/DRESSINGS) IMPLANT
DEVICE TROCAR PUNCTURE CLOSURE (ENDOMECHANICALS) IMPLANT
DRESSING TELFA ISLAND 4X8 (GAUZE/BANDAGES/DRESSINGS) ×3 IMPLANT
DRSG COVADERM PLUS 2X2 (GAUZE/BANDAGES/DRESSINGS) ×9 IMPLANT
DRSG OPSITE POSTOP 3X4 (GAUZE/BANDAGES/DRESSINGS) IMPLANT
DURAPREP 26ML APPLICATOR (WOUND CARE) ×3 IMPLANT
ELECT NEEDLE TIP 2.8 STRL (NEEDLE) IMPLANT
ELECT REM PT RETURN 9FT ADLT (ELECTROSURGICAL) ×3
ELECTRODE REM PT RTRN 9FT ADLT (ELECTROSURGICAL) ×1 IMPLANT
FILTER SMOKE EVAC LAPAROSHD (FILTER) ×3 IMPLANT
GLOVE BIO SURGEON STRL SZ8 (GLOVE) ×6 IMPLANT
GLOVE BIOGEL PI IND STRL 7.0 (GLOVE) ×2 IMPLANT
GLOVE BIOGEL PI IND STRL 8.5 (GLOVE) ×1 IMPLANT
GLOVE BIOGEL PI INDICATOR 7.0 (GLOVE) ×4
GLOVE BIOGEL PI INDICATOR 8.5 (GLOVE) ×2
GOWN STRL REUS W/ TWL LRG LVL3 (GOWN DISPOSABLE) ×2 IMPLANT
GOWN STRL REUS W/TWL LRG LVL3 (GOWN DISPOSABLE) ×13 IMPLANT
HOLDER FOLEY CATH W/STRAP (MISCELLANEOUS) IMPLANT
KIT RM TURNOVER CYSTO AR (KITS) ×3 IMPLANT
MANIPULATOR UTERINE 4.5 ZUMI (MISCELLANEOUS) ×3 IMPLANT
NDL SAFETY ECLIPSE 18X1.5 (NEEDLE) IMPLANT
NEEDLE HYPO 18GX1.5 SHARP (NEEDLE)
NEEDLE HYPO 25X1 1.5 SAFETY (NEEDLE) ×3 IMPLANT
NEEDLE INSUFFLATION 120MM (ENDOMECHANICALS) ×3 IMPLANT
NEEDLE INSUFFLATION 14GA 120MM (NEEDLE) ×3 IMPLANT
NS IRRIG 500ML POUR BTL (IV SOLUTION) ×3 IMPLANT
PACK LAPAROSCOPY BASIN (CUSTOM PROCEDURE TRAY) ×3 IMPLANT
PACK TRENDGUARD 450 HYBRID PRO (MISCELLANEOUS) ×1 IMPLANT
PAD OB MATERNITY 4.3X12.25 (PERSONAL CARE ITEMS) ×3 IMPLANT
PENCIL BUTTON HOLSTER BLD 10FT (ELECTRODE) IMPLANT
POUCH SPECIMEN RETRIEVAL 10MM (ENDOMECHANICALS) IMPLANT
PROTECTOR NERVE ULNAR (MISCELLANEOUS) ×6 IMPLANT
SEALER TISSUE G2 CVD JAW 35 (ENDOMECHANICALS) IMPLANT
SEALER TISSUE G2 CVD JAW 45CM (ENDOMECHANICALS) IMPLANT
SEPRAFILM MEMBRANE 5X6 (MISCELLANEOUS) IMPLANT
SET IRRIG TUBING LAPAROSCOPIC (IRRIGATION / IRRIGATOR) ×3 IMPLANT
SHEARS HARMONIC ACE PLUS 45CM (MISCELLANEOUS) IMPLANT
SUT MNCRL AB 4-0 PS2 18 (SUTURE) ×3 IMPLANT
SUT PROLENE 0 CT 1 30 (SUTURE) IMPLANT
SUT VIC AB 2-0 CT1 27 (SUTURE)
SUT VIC AB 2-0 CT1 TAPERPNT 27 (SUTURE) IMPLANT
SUT VIC AB 2-0 CT2 27 (SUTURE) IMPLANT
SUT VIC AB 2-0 UR6 27 (SUTURE) IMPLANT
SUT VIC AB 4-0 SH 27 (SUTURE)
SUT VIC AB 4-0 SH 27XANBCTRL (SUTURE) IMPLANT
SUT VICRYL 0 TIES 12 18 (SUTURE) IMPLANT
SYR 20CC LL (SYRINGE) IMPLANT
SYR 30ML LL (SYRINGE) IMPLANT
SYR 3ML 23GX1 SAFETY (SYRINGE) IMPLANT
SYR 50ML LL SCALE MARK (SYRINGE) IMPLANT
SYR 5ML LL (SYRINGE) ×3 IMPLANT
SYR CONTROL 10ML LL (SYRINGE) IMPLANT
SYS LAPSCP GELPORT 120MM (MISCELLANEOUS)
SYSTEM LAPSCP GELPORT 120MM (MISCELLANEOUS) IMPLANT
TOWEL OR 17X24 6PK STRL BLUE (TOWEL DISPOSABLE) ×6 IMPLANT
TRAY FOLEY CATH SILVER 14FR (SET/KITS/TRAYS/PACK) ×3 IMPLANT
TRENDGUARD 450 HYBRID PRO PACK (MISCELLANEOUS) ×3
TROCAR OPTI TIP 5M 100M (ENDOMECHANICALS) ×6 IMPLANT
TROCAR XCEL DIL TIP R 11M (ENDOMECHANICALS) IMPLANT
TUBING INSUF HEATED (TUBING) ×3 IMPLANT
WARMER LAPAROSCOPE (MISCELLANEOUS) ×3 IMPLANT
WATER STERILE IRR 500ML POUR (IV SOLUTION) ×3 IMPLANT

## 2017-03-25 NOTE — H&P (Signed)
Penny Horne is a 36 y.o. female , originally referred to me by Terri Skains , for Laparoscopic, Lysis of Adhesions; R Salpingectomy.  She was diagnosed with Pelvic Adhesions and Right hydrosalpinx, approximately 2 cm wide during an HSG.    Pertinent Gynecological History: Menses: flow is excessive with use of 3 pads or tampons on heaviest days Bleeding: dysfunctional uterine bleeding Contraception: none DES exposure: denies Blood transfusions: none Sexually transmitted diseases: no past history Previous GYN Procedures:Laparoscopic, salpingectomy of left Last mammogram: normal Last pap: normal     Menstrual History: Menarche age: 59 No LMP recorded.    Past Medical History:  Diagnosis Date  . 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 History of ectopic pregnancy    LEFT RUPTURED ECTOPIC PREG. S/P  SALPINGECTOMY 09-20-2015  . Hydrosalpinx    RIGHT  . Wears glasses                     Past Surgical History:  Procedure Laterality Date  . LAPAROSCOPY N/A 09/20/2015   Procedure: LAPAROSCOPY DIAGNOSTIC;  Surgeon: Shea Evans, MD;  Location: [facility];  Service: Gynecology;  Laterality: N/A;  . UNILATERAL SALPINGECTOMY Left 09/20/2015   Procedure: UNILATERAL SALPINGECTOMY;  Surgeon: Shea Evans, MD;  Location: [facility];  Service: Gynecology;  Laterality: Left;             History reviewed. No pertinent family history. No hereditary disease.  No cancer of breast, ovary, uterus. No cutaneous leiomyomatosis or renal cell carcinoma.  Social History   Social History  . Marital status: Married    Spouse name: N/A  . Number of children: N/A  . Years of education: N/A   Occupational History  . Not on file.   Social History Main Topics  . Smoking status: Former Smoker    Years: 20.00    Types: Cigarettes    Quit date: 09/05/2015  . Smokeless tobacco: Never Used     Comment: occasionally  . Alcohol use Yes     Comment: occasionally  . Drug use: No  . Sexual activity:  Yes   Other Topics Concern  . Not on file   Social History Narrative  . No narrative on file    No Known Allergies  No current facility-administered medications on file prior to encounter.    No current outpatient prescriptions on file prior to encounter.     Review of Systems  Constitutional: Negative.   HENT: Negative.   Eyes: Negative.   Respiratory: Negative.   Cardiovascular: Negative.   Gastrointestinal: Negative.   Genitourinary: Negative.   Musculoskeletal: Negative.   Skin: Negative.   Neurological: Negative.   Endo/Heme/Allergies: Negative.   Psychiatric/Behavioral: Negative.      Physical Exam  BP 126/75 (BP Location: Left Arm, Patient Position: Sitting)   Pulse 64   Temp 98.3 F (36.8 C) (Oral)   Resp 18   Ht 5\' 6"  (1.676 m)   Wt 108.4 kg (239 lb)   LMP 03/08/2017 (Exact Date)   SpO2 100%   BMI 38.58 kg/m  Constitutional: She is oriented to person, place, and time. She appears well-developed and well-nourished.  HENT:  Head: Normocephalic and atraumatic.  Nose: Nose normal.  Mouth/Throat: Oropharynx is clear and moist. No oropharyngeal exudate.  Eyes: Conjunctivae normal and EOM are normal. Pupils are equal, round, and reactive to light. No scleral icterus.  Neck: Normal range of motion. Neck supple. No tracheal deviation present. No thyromegaly present.  Cardiovascular: Normal rate.  Respiratory: Effort normal and breath sounds normal.  GI: Soft. Bowel sounds are normal. She exhibits no distension and no mass. There is no tenderness.  Lymphadenopathy:    She has no cervical adenopathy.  Neurological: She is alert and oriented to person, place, and time. She has normal reflexes.  Skin: Skin is warm.  Psychiatric: She has a normal mood and affect. Her behavior is normal. Judgment and thought content normal.       Assessment/Plan:  Pelvic Adhesions,right peritubal and periovarian adhesions, Right Hydrosalpinx  Preoperative for L/S, LOA; R  Salpingectomy Benefits and risks of laparoscopic surgery were discussed with the patient and her family member again. All of patient's questions were answered.  She verbalized understanding.

## 2017-03-25 NOTE — Anesthesia Postprocedure Evaluation (Signed)
Anesthesia Post Note  Patient: Penny Horne  Procedure(s) Performed: Procedure(s) (LRB): LAPAROSCOPY OPERATIVE (N/A) LAPAROSCOPY, LYSIS OF ADHESION, BILATERAL SALPINGECTOMY, CHROMOPERTUBATION, ENDOMETRIAL BIOPSY (N/A)     Patient location during evaluation: PACU Anesthesia Type: General Level of consciousness: awake and alert Pain management: pain level controlled Vital Signs Assessment: post-procedure vital signs reviewed and stable Respiratory status: spontaneous breathing, nonlabored ventilation, respiratory function stable and patient connected to nasal cannula oxygen Cardiovascular status: blood pressure returned to baseline and stable Postop Assessment: no signs of nausea or vomiting Anesthetic complications: no    Last Vitals:  Vitals:   03/25/17 1215 03/25/17 1245  BP: 107/68 117/78  Pulse: (!) 54 60  Resp: 14 16  Temp:  36.8 C    Last Pain:  Vitals:   03/25/17 1200  TempSrc:   PainSc: Asleep                 Torrion Witter

## 2017-03-25 NOTE — Anesthesia Preprocedure Evaluation (Signed)
Anesthesia Evaluation  Patient identified by MRN, date of birth, ID band Patient awake    Reviewed: Allergy & Precautions, NPO status , Patient's Chart, lab work & pertinent test results  History of Anesthesia Complications Negative for: history of anesthetic complications  Airway Mallampati: II  TM Distance: >3 FB Neck ROM: Full    Dental  (+) Teeth Intact   Pulmonary neg shortness of breath, neg sleep apnea, neg COPD, neg recent URI, former smoker,    breath sounds clear to auscultation       Cardiovascular negative cardio ROS   Rhythm:Regular     Neuro/Psych negative neurological ROS  negative psych ROS   GI/Hepatic negative GI ROS, Neg liver ROS,   Endo/Other  Morbid obesity  Renal/GU negative Renal ROS     Musculoskeletal   Abdominal   Peds  Hematology negative hematology ROS (+)   Anesthesia Other Findings   Reproductive/Obstetrics                             Anesthesia Physical Anesthesia Plan  ASA: II  Anesthesia Plan: General   Post-op Pain Management:    Induction: Intravenous  PONV Risk Score and Plan: 3 and Ondansetron, Dexamethasone, Propofol and Midazolam  Airway Management Planned: Oral ETT  Additional Equipment: None  Intra-op Plan:   Post-operative Plan: Extubation in OR  Informed Consent: I have reviewed the patients History and Physical, chart, labs and discussed the procedure including the risks, benefits and alternatives for the proposed anesthesia with the patient or authorized representative who has indicated his/her understanding and acceptance.   Dental advisory given  Plan Discussed with: CRNA and Surgeon  Anesthesia Plan Comments:         Anesthesia Quick Evaluation

## 2017-03-25 NOTE — Anesthesia Procedure Notes (Signed)
Procedure Name: Intubation Date/Time: 03/25/2017 10:21 AM Performed by: Bethena Roys T Pre-anesthesia Checklist: Patient identified, Emergency Drugs available, Suction available and Patient being monitored Patient Re-evaluated:Patient Re-evaluated prior to induction Oxygen Delivery Method: Circle system utilized Preoxygenation: Pre-oxygenation with 100% oxygen Induction Type: IV induction Ventilation: Mask ventilation without difficulty Laryngoscope Size: Mac and 3 Grade View: Grade I Tube type: Oral Tube size: 7.0 mm Number of attempts: 1 Airway Equipment and Method: Stylet and Oral airway Placement Confirmation: ETT inserted through vocal cords under direct vision,  positive ETCO2 and breath sounds checked- equal and bilateral Secured at: 21 cm Tube secured with: Tape Dental Injury: Teeth and Oropharynx as per pre-operative assessment

## 2017-03-25 NOTE — Discharge Instructions (Addendum)
DISCHARGE INSTRUCTIONS: Laparoscopy ° °The following instructions have been prepared to help you care for yourself upon your return home today. ° °Wound care: °• Do not get the incision wet for the first 24 hours. The incision should be kept clean and dry. °• The Band-Aids or dressings may be removed the day after surgery. °• Should the incision become sore, red, and swollen after the first week, check with your doctor. ° °Personal hygiene: °• Shower the day after your procedure. ° °Activity and limitations: °• Do NOT drive or operate any equipment today. °• Do NOT lift anything more than 15 pounds for 2-3 weeks after surgery. °• Do NOT rest in bed all day. °• Walking is encouraged. Walk each day, starting slowly with 5-minute walks 3 or 4 times a day. Slowly increase the length of your walks. °• Walk up and down stairs slowly. °• Do NOT do strenuous activities, such as golfing, playing tennis, bowling, running, biking, weight lifting, gardening, mowing, or vacuuming for 2-4 weeks. Ask your doctor when it is okay to start. ° °Diet: Eat a light meal as desired this evening. You may resume your usual diet tomorrow. ° °Return to work: This is dependent on the type of work you do. For the most part you can return to a desk job within a week of surgery. If you are more active at work, please discuss this with your doctor. ° °What to expect after your surgery: You may have a slight burning sensation when you urinate on the first day. You may have a very small amount of blood in the urine. Expect to have a small amount of vaginal discharge/light bleeding for 1-2 weeks. It is not unusual to have abdominal soreness and bruising for up to 2 weeks. You may be tired and need more rest for about 1 week. You may experience shoulder pain for 24-72 hours. Lying flat in bed may relieve it. ° °Call your doctor for any of the following: °• Develop a fever of 100.4 or greater °• Inability to urinate 6 hours after discharge from  hospital °• Severe pain not relieved by pain medications °• Persistent of heavy bleeding at incision site °• Redness or swelling around incision site after a week °• Increasing nausea or vomiting ° °Patient Signature________________________________________ °Nurse Signature_________________________________________ °Post Anesthesia Home Care Instructions ° °Activity: °Get plenty of rest for the remainder of the day. A responsible individual must stay with you for 24 hours following the procedure.  °For the next 24 hours, DO NOT: °-Drive a car °-Operate machinery °-Drink alcoholic beverages °-Take any medication unless instructed by your physician °-Make any legal decisions or sign important papers. ° °Meals: °Start with liquid foods such as gelatin or soup. Progress to regular foods as tolerated. Avoid greasy, spicy, heavy foods. If nausea and/or vomiting occur, drink only clear liquids until the nausea and/or vomiting subsides. Call your physician if vomiting continues. ° °Special Instructions/Symptoms: °Your throat may feel dry or sore from the anesthesia or the breathing tube placed in your throat during surgery. If this causes discomfort, gargle with warm salt water. The discomfort should disappear within 24 hours. ° °If you had a scopolamine patch placed behind your ear for the management of post- operative nausea and/or vomiting: ° °1. The medication in the patch is effective for 72 hours, after which it should be removed.  Wrap patch in a tissue and discard in the trash. Wash hands thoroughly with soap and water. °2. You may remove the patch   earlier than 72 hours if you experience unpleasant side effects which may include dry mouth, dizziness or visual disturbances. °3. Avoid touching the patch. Wash your hands with soap and water after contact with the patch. °   ° °

## 2017-03-25 NOTE — Transfer of Care (Addendum)
Immediate Anesthesia Transfer of Care Note  Patient: Penny Horne  Procedure(s) Performed: Procedure(s): LAPAROSCOPY OPERATIVE (N/A) LYSIS OF ADHESION, REMOVAL OF HYDROSALPINX (N/A)  Patient Location: PACU  Anesthesia Type:General  Level of Consciousness: drowsy and responds to stimulation  Airway & Oxygen Therapy: Patient Spontanous Breathing and Patient connected to nasal cannula oxygen  Post-op Assessment: Report given to RN  Post vital signs: Reviewed and stable  Last Vitals: 119/68, 66, 11, 100%, 97.9 Vitals:   03/25/17 0710  BP: 126/75  Pulse: 64  Resp: 18  Temp: 36.8 C    Last Pain:  Vitals:   03/25/17 0710  TempSrc: Oral      Patients Stated Pain Goal: 6 (03/25/17 0743)  Complications: No apparent anesthesia complications

## 2017-03-25 NOTE — Op Note (Signed)
Operative Note  Preoperative diagnosis: Right hydrosalpinx, infertility  Postoperative diagnosis: Right hydrosalpinx, hydrosalpinx of the left proximal tube, pelvic adhesions, infertility  Procedure: Laparoscopy, lysis of adhesions, bilateral salpingectomy, endometrial biopsy, chromotubation  Anesthesia: Gen. endotracheal  Complications: None  Estimated blood loss: <10 cc  Specimens: Right hydrosalpinx and proximal left tube and endometrial biopsy to pathology  Findings: On laparoscopy, upper abdomen, liver surface and diaphragm surfaces were normal. Gallbladder was normal. The appendix was not visualized. The pelvic peritoneum looked mostly normal except for inflammatory spots in the posterior cul-de-sac. Anterior cul-de-sac was normal. The uterus was normal. The left ovary had filmy adhesions to the pelvic sidewall. The left tube distal half was surgically absent. The proximal portion of the left tube was noted to have a small hydrosalpinx. The posterior cul-de-sac contained inflammatory spots. There was no evidence of endometriosis. The right tube was distally blocked with normal fimbria identifiable. It had formed a 2-2.5 cm hydrosalpinx that was convoluted and adherent at its distal portion to the right ovary. The right ovary had filmy adhesions to the pelvic sidewall.  Description of the procedure: The patient was placed in dorsal supine position and general endotracheal anesthesia was given. 2 g of cefazolin were given intravenously for prophylaxis. Patient was placed in lithotomy position. She was prepped and draped inside manner.  A Foley catheter was placed in the bladder. Pipelle device was used to perform an endometrial biopsy. The uterus sounded to 8 cm . A ZUMI catheter was placed into the uterine cavity. The surgeon was regloved and a surgical field was created on the abdomen.  After preemptive anesthesia of all surgical sites with 0.25% bupivacaine with 1 200,000 epinephrine, a  5 mm intraumbilical skin incision was made and a Verress needle was inserted. Its correct location was confirmed. A pneumoperitoneum was created with carbon dioxide.  5 mm laparoscope with a 30 lens was inserted and video laparoscopy was started . A left lower quadrant 5 mm and a right lower quadrant 5 mm incisions were made and ancillary trochars were placed under direct visualization. Above findings were noted.  Using a needle electrode on 35 W of cutting current, the filmy adhesions on the mesentery of the sigmoid colon were lysed.  Decision was made to remove the right hydrosalpinx because of its convoluted and adherent state, rather than performing a salpingostomy. Harmonic Ace was applied on the right uterotubal junction and the tube was transected. Then successive application of the Harmonic Ace on the mesosalpinx very close to the tube resulted in complete complete removal of the right hydrosalpinx. Attention was given not to spread the coagulation into the broad ligament or the infundibulopelvic ligament. The same steps were used to remove the proximal portion of the left tube which had formed a small hydrosalpinx. Both specimens were removed from the abdomen and submitted to pathology. The incisions were approximated with subcuticular stitches of 4-0 Monocryl.   The patient tolerated the procedure well and was transferred to recovery room in satisfactory condition.    Fermin SchwabYALCINKAYA,Laurann Mcmorris, MD

## 2017-03-26 ENCOUNTER — Encounter (HOSPITAL_BASED_OUTPATIENT_CLINIC_OR_DEPARTMENT_OTHER): Payer: Self-pay | Admitting: Obstetrics and Gynecology

## 2017-04-14 DIAGNOSIS — Z3183 Encounter for assisted reproductive fertility procedure cycle: Secondary | ICD-10-CM | POA: Diagnosis not present

## 2017-04-25 DIAGNOSIS — Z3183 Encounter for assisted reproductive fertility procedure cycle: Secondary | ICD-10-CM | POA: Diagnosis not present

## 2017-05-07 DIAGNOSIS — N840 Polyp of corpus uteri: Secondary | ICD-10-CM | POA: Diagnosis not present

## 2017-05-07 DIAGNOSIS — Z319 Encounter for procreative management, unspecified: Secondary | ICD-10-CM | POA: Diagnosis not present

## 2017-05-12 DIAGNOSIS — N840 Polyp of corpus uteri: Secondary | ICD-10-CM | POA: Diagnosis not present

## 2017-05-21 DIAGNOSIS — M7752 Other enthesopathy of left foot: Secondary | ICD-10-CM | POA: Diagnosis not present

## 2017-05-21 DIAGNOSIS — M7751 Other enthesopathy of right foot: Secondary | ICD-10-CM | POA: Diagnosis not present

## 2017-05-21 DIAGNOSIS — M659 Synovitis and tenosynovitis, unspecified: Secondary | ICD-10-CM | POA: Diagnosis not present

## 2017-06-18 DIAGNOSIS — Z3183 Encounter for assisted reproductive fertility procedure cycle: Secondary | ICD-10-CM | POA: Diagnosis not present

## 2017-06-28 DIAGNOSIS — Z3183 Encounter for assisted reproductive fertility procedure cycle: Secondary | ICD-10-CM | POA: Diagnosis not present

## 2017-07-07 DIAGNOSIS — Z3200 Encounter for pregnancy test, result unknown: Secondary | ICD-10-CM | POA: Diagnosis not present

## 2017-07-09 DIAGNOSIS — Z3201 Encounter for pregnancy test, result positive: Secondary | ICD-10-CM | POA: Diagnosis not present

## 2017-07-14 DIAGNOSIS — Z3201 Encounter for pregnancy test, result positive: Secondary | ICD-10-CM | POA: Diagnosis not present

## 2017-07-30 DIAGNOSIS — Z3200 Encounter for pregnancy test, result unknown: Secondary | ICD-10-CM | POA: Diagnosis not present

## 2017-08-06 DIAGNOSIS — O0900 Supervision of pregnancy with history of infertility, unspecified trimester: Secondary | ICD-10-CM | POA: Diagnosis not present

## 2017-08-15 DIAGNOSIS — Z3689 Encounter for other specified antenatal screening: Secondary | ICD-10-CM | POA: Diagnosis not present

## 2017-08-15 DIAGNOSIS — Z3A09 9 weeks gestation of pregnancy: Secondary | ICD-10-CM | POA: Diagnosis not present

## 2017-08-15 DIAGNOSIS — Z118 Encounter for screening for other infectious and parasitic diseases: Secondary | ICD-10-CM | POA: Diagnosis not present

## 2017-08-15 DIAGNOSIS — Z0000 Encounter for general adult medical examination without abnormal findings: Secondary | ICD-10-CM | POA: Diagnosis not present

## 2017-08-15 DIAGNOSIS — O09521 Supervision of elderly multigravida, first trimester: Secondary | ICD-10-CM | POA: Diagnosis not present

## 2017-08-15 LAB — OB RESULTS CONSOLE GC/CHLAMYDIA
CHLAMYDIA, DNA PROBE: NEGATIVE
Gonorrhea: NEGATIVE

## 2017-08-15 LAB — OB RESULTS CONSOLE RUBELLA ANTIBODY, IGM: Rubella: IMMUNE

## 2017-08-15 LAB — OB RESULTS CONSOLE RPR: RPR: NONREACTIVE

## 2017-08-15 LAB — OB RESULTS CONSOLE HEPATITIS B SURFACE ANTIGEN: Hepatitis B Surface Ag: NEGATIVE

## 2017-08-15 LAB — OB RESULTS CONSOLE HIV ANTIBODY (ROUTINE TESTING): HIV: NONREACTIVE

## 2017-09-02 NOTE — L&D Delivery Note (Signed)
Operative Delivery Note At 1:14 AM a viable and healthy female was delivered via Vaginal, Spontaneous.  Presentation: vertex; Position: Occiput,, Anterior; Station: +5. Verbal consent: obtained from patient regarding shoulder dystocia management if needed and she agreed.   Delivery of the head: 03/17/2018  1:13 AM. Shoulder dystocia anticipated. 2 RNs in room and extra team, Shoulder dystocia called. Timer started. Shoulders were palpated to assess position and RN informed to start maneuvers.  First maneuver: 03/17/2018  1:13 AM, McRoberts Second maneuver: 03/17/2018  1:13 AM, Suprapubic Pressure from maternal left. At 50 seconds, left/ anterior shoulder delivered.   NICU team arrived but left immediately as baby recovered with stimulation and cried.   APGAR: 8, 9; weight  .   Placenta status: spontaneous with some manual assistance from lower segment.  Cord:  with the following complications: none.  Cord pH: (arterial) 7.2  Anesthesia:  Epidural Episiotomy: None Lacerations: None Est. Blood Loss (mL): 600 all before placenta delivery.   Mom to postpartum.  Baby to Couplet care / Skin to Skin.  Robley FriesVaishali R Maks Cavallero 03/17/2018, 1:42 AM

## 2017-09-04 DIAGNOSIS — O09522 Supervision of elderly multigravida, second trimester: Secondary | ICD-10-CM | POA: Diagnosis not present

## 2017-09-04 DIAGNOSIS — O09521 Supervision of elderly multigravida, first trimester: Secondary | ICD-10-CM | POA: Diagnosis not present

## 2017-09-04 DIAGNOSIS — Z3A12 12 weeks gestation of pregnancy: Secondary | ICD-10-CM | POA: Diagnosis not present

## 2017-09-04 DIAGNOSIS — O3121X0 Continuing pregnancy after intrauterine death of one fetus or more, first trimester, not applicable or unspecified: Secondary | ICD-10-CM | POA: Diagnosis not present

## 2017-09-04 DIAGNOSIS — Z3480 Encounter for supervision of other normal pregnancy, unspecified trimester: Secondary | ICD-10-CM | POA: Diagnosis not present

## 2017-09-04 DIAGNOSIS — Z3A16 16 weeks gestation of pregnancy: Secondary | ICD-10-CM | POA: Diagnosis not present

## 2017-09-04 DIAGNOSIS — Z361 Encounter for antenatal screening for raised alphafetoprotein level: Secondary | ICD-10-CM | POA: Diagnosis not present

## 2017-09-04 DIAGNOSIS — O09811 Supervision of pregnancy resulting from assisted reproductive technology, first trimester: Secondary | ICD-10-CM | POA: Diagnosis not present

## 2017-09-04 DIAGNOSIS — O09812 Supervision of pregnancy resulting from assisted reproductive technology, second trimester: Secondary | ICD-10-CM | POA: Diagnosis not present

## 2017-09-30 DIAGNOSIS — Z3A16 16 weeks gestation of pregnancy: Secondary | ICD-10-CM | POA: Diagnosis not present

## 2017-09-30 DIAGNOSIS — O09522 Supervision of elderly multigravida, second trimester: Secondary | ICD-10-CM | POA: Diagnosis not present

## 2017-09-30 DIAGNOSIS — O09812 Supervision of pregnancy resulting from assisted reproductive technology, second trimester: Secondary | ICD-10-CM | POA: Diagnosis not present

## 2017-09-30 DIAGNOSIS — Z361 Encounter for antenatal screening for raised alphafetoprotein level: Secondary | ICD-10-CM | POA: Diagnosis not present

## 2017-10-21 DIAGNOSIS — Z3A19 19 weeks gestation of pregnancy: Secondary | ICD-10-CM | POA: Diagnosis not present

## 2017-10-21 DIAGNOSIS — O09522 Supervision of elderly multigravida, second trimester: Secondary | ICD-10-CM | POA: Diagnosis not present

## 2017-10-21 DIAGNOSIS — O09812 Supervision of pregnancy resulting from assisted reproductive technology, second trimester: Secondary | ICD-10-CM | POA: Diagnosis not present

## 2017-11-11 DIAGNOSIS — Z362 Encounter for other antenatal screening follow-up: Secondary | ICD-10-CM | POA: Diagnosis not present

## 2017-11-11 DIAGNOSIS — Z3A22 22 weeks gestation of pregnancy: Secondary | ICD-10-CM | POA: Diagnosis not present

## 2017-11-11 DIAGNOSIS — O09812 Supervision of pregnancy resulting from assisted reproductive technology, second trimester: Secondary | ICD-10-CM | POA: Diagnosis not present

## 2017-11-11 DIAGNOSIS — O09522 Supervision of elderly multigravida, second trimester: Secondary | ICD-10-CM | POA: Diagnosis not present

## 2017-12-03 DIAGNOSIS — O09812 Supervision of pregnancy resulting from assisted reproductive technology, second trimester: Secondary | ICD-10-CM | POA: Diagnosis not present

## 2017-12-03 DIAGNOSIS — O09522 Supervision of elderly multigravida, second trimester: Secondary | ICD-10-CM | POA: Diagnosis not present

## 2017-12-03 DIAGNOSIS — Z3A25 25 weeks gestation of pregnancy: Secondary | ICD-10-CM | POA: Diagnosis not present

## 2017-12-22 DIAGNOSIS — Z3689 Encounter for other specified antenatal screening: Secondary | ICD-10-CM | POA: Diagnosis not present

## 2017-12-22 DIAGNOSIS — Z23 Encounter for immunization: Secondary | ICD-10-CM | POA: Diagnosis not present

## 2017-12-22 DIAGNOSIS — O09523 Supervision of elderly multigravida, third trimester: Secondary | ICD-10-CM | POA: Diagnosis not present

## 2017-12-22 DIAGNOSIS — Z3A28 28 weeks gestation of pregnancy: Secondary | ICD-10-CM | POA: Diagnosis not present

## 2017-12-22 DIAGNOSIS — O09813 Supervision of pregnancy resulting from assisted reproductive technology, third trimester: Secondary | ICD-10-CM | POA: Diagnosis not present

## 2018-02-19 LAB — OB RESULTS CONSOLE GBS: STREP GROUP B AG: NEGATIVE

## 2018-03-16 ENCOUNTER — Inpatient Hospital Stay (HOSPITAL_COMMUNITY): Payer: Commercial Managed Care - PPO | Admitting: Anesthesiology

## 2018-03-16 ENCOUNTER — Inpatient Hospital Stay (HOSPITAL_COMMUNITY)
Admission: RE | Admit: 2018-03-16 | Discharge: 2018-03-18 | DRG: 807 | Disposition: A | Discharge status: Home / Self Care | Payer: Commercial Managed Care - PPO | Source: Ambulatory Visit | Attending: Obstetrics & Gynecology | Admitting: Obstetrics & Gynecology

## 2018-03-16 ENCOUNTER — Inpatient Hospital Stay (HOSPITAL_COMMUNITY)
Admission: AD | Admit: 2018-03-16 | Payer: BLUE CROSS/BLUE SHIELD | Source: Ambulatory Visit | Admitting: Obstetrics & Gynecology

## 2018-03-16 ENCOUNTER — Encounter (HOSPITAL_COMMUNITY): Payer: Self-pay | Admitting: *Deleted

## 2018-03-16 DIAGNOSIS — Z3490 Encounter for supervision of normal pregnancy, unspecified, unspecified trimester: Secondary | ICD-10-CM

## 2018-03-16 DIAGNOSIS — O9902 Anemia complicating childbirth: Secondary | ICD-10-CM | POA: Diagnosis present

## 2018-03-16 DIAGNOSIS — D500 Iron deficiency anemia secondary to blood loss (chronic): Secondary | ICD-10-CM | POA: Diagnosis present

## 2018-03-16 DIAGNOSIS — Z87891 Personal history of nicotine dependence: Secondary | ICD-10-CM

## 2018-03-16 DIAGNOSIS — Z3A40 40 weeks gestation of pregnancy: Secondary | ICD-10-CM | POA: Diagnosis not present

## 2018-03-16 DIAGNOSIS — O09813 Supervision of pregnancy resulting from assisted reproductive technology, third trimester: Secondary | ICD-10-CM

## 2018-03-16 DIAGNOSIS — D649 Anemia, unspecified: Secondary | ICD-10-CM | POA: Diagnosis not present

## 2018-03-16 HISTORY — DX: Supervision of pregnancy resulting from assisted reproductive technology, third trimester: O09.813

## 2018-03-16 LAB — CBC
HCT: 33.6 % — ABNORMAL LOW (ref 36.0–46.0)
Hemoglobin: 11.4 g/dL — ABNORMAL LOW (ref 12.0–15.0)
MCH: 31.5 pg (ref 26.0–34.0)
MCHC: 33.9 g/dL (ref 30.0–36.0)
MCV: 92.8 fL (ref 78.0–100.0)
PLATELETS: 194 10*3/uL (ref 150–400)
RBC: 3.62 MIL/uL — ABNORMAL LOW (ref 3.87–5.11)
RDW: 13.9 % (ref 11.5–15.5)
WBC: 7.8 10*3/uL (ref 4.0–10.5)

## 2018-03-16 LAB — TYPE AND SCREEN
ABO/RH(D): A POS
Antibody Screen: NEGATIVE

## 2018-03-16 MED ORDER — DIPHENHYDRAMINE HCL 50 MG/ML IJ SOLN: 12.5000 mg | INTRAMUSCULAR | Status: DC | PRN

## 2018-03-16 MED ORDER — LACTATED RINGERS IV SOLN
INTRAVENOUS | Status: DC
  Administered 2018-03-16 – 2018-03-17 (×4): via INTRAVENOUS

## 2018-03-16 MED ORDER — FENTANYL CITRATE (PF) 100 MCG/2ML IJ SOLN
INTRAMUSCULAR | Status: AC
  Administered 2018-03-16: 100 ug
  Filled 2018-03-16: qty 2

## 2018-03-16 MED ORDER — LIDOCAINE HCL (PF) 1 % IJ SOLN
INTRAMUSCULAR | Status: DC | PRN
  Administered 2018-03-16: 8 mL via EPIDURAL

## 2018-03-16 MED ORDER — FENTANYL 2.5 MCG/ML BUPIVACAINE 1/10 % EPIDURAL INFUSION (WH - ANES)
14.0000 mL/h | INTRAMUSCULAR | Status: DC | PRN
  Administered 2018-03-16 – 2018-03-17 (×2): 14 mL/h via EPIDURAL
  Filled 2018-03-16 (×2): qty 100

## 2018-03-16 MED ORDER — OXYCODONE-ACETAMINOPHEN 5-325 MG PO TABS: 1.0000 | ORAL_TABLET | ORAL | Status: DC | PRN

## 2018-03-16 MED ORDER — LACTATED RINGERS IV SOLN
500.0000 mL | Freq: Once | INTRAVENOUS | Status: AC
  Administered 2018-03-16: 500 mL via INTRAVENOUS

## 2018-03-16 MED ORDER — EPHEDRINE 5 MG/ML INJ
10.0000 mg | INTRAVENOUS | Status: DC | PRN
  Filled 2018-03-16: qty 2

## 2018-03-16 MED ORDER — PHENYLEPHRINE 40 MCG/ML (10ML) SYRINGE FOR IV PUSH (FOR BLOOD PRESSURE SUPPORT)
80.0000 ug | PREFILLED_SYRINGE | INTRAVENOUS | Status: DC | PRN
  Filled 2018-03-16: qty 5

## 2018-03-16 MED ORDER — OXYCODONE-ACETAMINOPHEN 5-325 MG PO TABS: 2.0000 | ORAL_TABLET | ORAL | Status: DC | PRN

## 2018-03-16 MED ORDER — LACTATED RINGERS IV SOLN
500.0000 mL | INTRAVENOUS | Status: DC | PRN
  Administered 2018-03-16 – 2018-03-17 (×2): 500 mL via INTRAVENOUS

## 2018-03-16 MED ORDER — ONDANSETRON HCL 4 MG/2ML IJ SOLN: 4.0000 mg | Freq: Four times a day (QID) | INTRAMUSCULAR | Status: DC | PRN

## 2018-03-16 MED ORDER — FENTANYL CITRATE (PF) 100 MCG/2ML IJ SOLN: 100.0000 ug | INTRAMUSCULAR | Status: DC | PRN

## 2018-03-16 MED ORDER — FLEET ENEMA 7-19 GM/118ML RE ENEM: 1.0000 | ENEMA | RECTAL | Status: DC | PRN

## 2018-03-16 MED ORDER — SOD CITRATE-CITRIC ACID 500-334 MG/5ML PO SOLN: 30.0000 mL | ORAL | Status: DC | PRN

## 2018-03-16 MED ORDER — LIDOCAINE HCL (PF) 1 % IJ SOLN
30.0000 mL | INTRAMUSCULAR | Status: DC | PRN
  Filled 2018-03-16: qty 30

## 2018-03-16 MED ORDER — PHENYLEPHRINE 40 MCG/ML (10ML) SYRINGE FOR IV PUSH (FOR BLOOD PRESSURE SUPPORT)
80.0000 ug | PREFILLED_SYRINGE | INTRAVENOUS | Status: DC | PRN
  Filled 2018-03-16: qty 5
  Filled 2018-03-16: qty 10

## 2018-03-16 MED ORDER — BUTORPHANOL TARTRATE 1 MG/ML IJ SOLN
1.0000 mg | Freq: Once | INTRAMUSCULAR | Status: AC
  Administered 2018-03-16: 1 mg via INTRAVENOUS
  Filled 2018-03-16: qty 1

## 2018-03-16 MED ORDER — OXYTOCIN 40 UNITS IN LACTATED RINGERS INFUSION - SIMPLE MED
2.5000 [IU]/h | INTRAVENOUS | Status: DC
  Administered 2018-03-17: 2.5 [IU]/h via INTRAVENOUS

## 2018-03-16 MED ORDER — TERBUTALINE SULFATE 1 MG/ML IJ SOLN
0.2500 mg | Freq: Once | INTRAMUSCULAR | Status: DC | PRN
  Filled 2018-03-16: qty 1

## 2018-03-16 MED ORDER — ACETAMINOPHEN 325 MG PO TABS: 650.0000 mg | ORAL_TABLET | ORAL | Status: DC | PRN

## 2018-03-16 MED ORDER — OXYTOCIN 40 UNITS IN LACTATED RINGERS INFUSION - SIMPLE MED
1.0000 m[IU]/min | INTRAVENOUS | Status: DC
  Administered 2018-03-16: 2 m[IU]/min via INTRAVENOUS
  Filled 2018-03-16: qty 1000

## 2018-03-16 MED ORDER — OXYTOCIN BOLUS FROM INFUSION
500.0000 mL | Freq: Once | INTRAVENOUS | Status: AC
  Administered 2018-03-17: 500 mL via INTRAVENOUS

## 2018-03-16 NOTE — H&P (Signed)
Penny Horne is a 37 y.o. female here at 40 wks, IVF pregnancy, here for IOL.  1st kid is 21- SVD 8.1/2 lbs Now IVF pregnancy for tubal factor.  Good PNCare, uncomplicated, normal growth in 3rd trim.   OB History    Gravida  3   Para  1   Term  1   Preterm  0   AB  1   Living  1     SAB  0   TAB  0   Ectopic  1   Multiple  0   Live Births             Past Medical History:  Diagnosis Date  . 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 History of ectopic pregnancy    LEFT RUPTURED ECTOPIC PREG. S/P  SALPINGECTOMY 09-20-2015  . Hydrosalpinx    RIGHT  . Wears glasses    Past Surgical History:  Procedure Laterality Date  . LAPAROSCOPY N/A 09/20/2015   Procedure: LAPAROSCOPY DIAGNOSTIC;  Surgeon: Shea EvansVaishali Gio Janoski, MD;  Location: [facility];  Service: Gynecology;  Laterality: N/A;  . LAPAROSCOPY N/A 03/25/2017   Procedure: LAPAROSCOPY OPERATIVE;  Surgeon: Fermin SchwabYalcinkaya, Tamer, MD;  [facility] [place];  Service: Gynecology;  Laterality: N/A;  . LYSIS OF ADHESION N/A 03/25/2017   Procedure: LAPAROSCOPY, LYSIS OF ADHESION, BILATERAL SALPINGECTOMY, CHROMOPERTUBATION, ENDOMETRIAL BIOPSY;  Surgeon: Fermin SchwabYalcinkaya, Tamer, MD;  [facility] [place];  Service: Gynecology;  Laterality: N/A;  . UNILATERAL SALPINGECTOMY Left 09/20/2015   Procedure: UNILATERAL SALPINGECTOMY;  Surgeon: Shea EvansVaishali Shauntia Levengood, MD;  Location: [facility];  Service: Gynecology;  Laterality: Left;   Family History: family history is not on file. Social History:  reports that she quit smoking about 2 years ago. Her smoking use included cigarettes. She quit after 20.00 years of use. She has never used smokeless tobacco. She reports that she drinks alcohol. She reports that she does not use drugs.     Maternal Diabetes: No Genetic Screening: Normal Maternal Ultrasounds/Referrals: Normal Fetal Ultrasounds or other Referrals:  None Maternal Substance Abuse:  No Significant Maternal Medications:   None Significant Maternal Lab Results:  Lab values include: Group B Strep negative Other Comments:  IVF preg   ROS History Blood pressure 131/81, pulse 87, temperature 98.1 F (36.7 C), temperature source Oral, resp. rate 18, height 5\' 7"  (1.702 m), weight 248 lb 9.6 oz (112.8 kg), unknown if currently breastfeeding. Exam Physical Exam  Physical exam:  A&O x 3, no acute distress. Pleasant HEENT neg, no thyromegaly Lungs CTA bilat CV RRR, S1S2 normal Abdo soft, non tender, non acute Extr no edema/ tenderness Pelvic 2/50%/-3 FHT  140s Cat I Toco rare   Prenatal labs: ABO, Rh: --/--/A POS (07/15 16100850) Antibody: NEG (07/15 0850) Rubella: Immune (12/14 0000) RPR: Nonreactive (12/14 0000)  HBsAg: Negative (12/14 0000)  HIV: Non-reactive (12/14 0000)  GBS: Negative (06/20 0000)   Assessment/Plan: AMA, IVF preg, G2p1001. Pitocin, Pain mngmt options. GBS(-).  Anticipate SVD 8 lbs    Robley FriesVaishali R Minna Dumire 03/16/2018, 3:02 PM

## 2018-03-16 NOTE — Progress Notes (Signed)
Penny Reinaldo MeekerC Horne is a 37 y.o. G3P1011 at 2613w0d by IOL for IVF preg  Subjective: UCs getting stronger  Objective: BP 125/75   Pulse 83   Temp 97.8 F (36.6 Penny) (Oral)   Resp 18   Ht 5\' 7"  (1.702 m)   Wt 248 lb 9.6 oz (112.8 kg)   SpO2 99%   BMI 38.94 kg/m  No intake/output data recorded. Total I/O In: 10.5 [I.V.:10.5] Out: -   FHT:  FHR: 140s bpm, variability: moderate,  accelerations:  Present,  decelerations:  Absent UC:   regular, every 3 minutes SVE: 2-3/ 70%/-3. AROM clear  Labs: Lab Results  Component Value Date   WBC 7.8 03/16/2018   HGB 11.4 (L) 03/16/2018   HCT 33.6 (L) 03/16/2018   MCV 92.8 03/16/2018   PLT 194 03/16/2018    Assessment / Plan: IOL progressing well.  Epidural ok, nitrous ok Anticipate SVD 8lbs GBS neg   Robley FriesVaishali R Grahm Etsitty 03/16/2018, 5:48 PM

## 2018-03-16 NOTE — Anesthesia Pain Management Evaluation Note (Signed)
  CRNA Pain Management Visit Note  Patient: Penny Horne, 37 y.o., female  "Hello I am a [facility] the [facility]. We have an anesthesia team available at all times to provide care throughout the hospital, including epidural management and anesthesia for C-section. I don't know your plan for the delivery whether it a natural birth, water birth, IV sedation, nitrous supplementation, doula or epidural, but we want to meet your pain goals."   1.Was your pain managed to your expectations on prior hospitalizations?   Yes   2.What is your expectation for pain management during this hospitalization?     IV pain meds  3.How can we help you reach that goal? support  Record the patient's initial score and the patient's pain goal.   Pain: 3  Pain Goal: 7 [facility] wants you to be able to say your pain was always managed very well.  Trellis PaganiniBREWER,Danelly Hassinger N 03/16/2018

## 2018-03-16 NOTE — Anesthesia Procedure Notes (Signed)
Epidural Patient location during procedure: OB Start time: 03/16/2018 5:10 PM End time: 03/16/2018 5:20 PM  Staffing Anesthesiologist: Bethena Midgetddono, Austen Oyster, MD  Preanesthetic Checklist Completed: patient identified, site marked, surgical consent, pre-op evaluation, timeout performed, IV checked, risks and benefits discussed and monitors and equipment checked  Epidural Patient position: sitting Prep: site prepped and draped and DuraPrep Patient monitoring: continuous pulse ox and blood pressure Approach: midline Location: L3-L4 Injection technique: LOR air  Needle:  Needle type: Tuohy  Needle gauge: 17 G Needle length: 9 cm and 9 Needle insertion depth: 8 cm Catheter type: closed end flexible Catheter size: 19 Gauge Catheter at skin depth: 13 cm Test dose: negative  Assessment Events: blood not aspirated, injection not painful, no injection resistance, negative IV test and no paresthesia

## 2018-03-16 NOTE — Anesthesia Preprocedure Evaluation (Signed)

## 2018-03-17 ENCOUNTER — Other Ambulatory Visit: Payer: Self-pay

## 2018-03-17 ENCOUNTER — Encounter (HOSPITAL_COMMUNITY): Payer: Self-pay

## 2018-03-17 DIAGNOSIS — O09813 Supervision of pregnancy resulting from assisted reproductive technology, third trimester: Secondary | ICD-10-CM

## 2018-03-17 HISTORY — DX: Supervision of pregnancy resulting from assisted reproductive technology, third trimester: O09.813

## 2018-03-17 LAB — RPR: RPR Ser Ql: NONREACTIVE

## 2018-03-17 MED ORDER — ONDANSETRON HCL 4 MG PO TABS: 4.0000 mg | ORAL_TABLET | ORAL | Status: DC | PRN

## 2018-03-17 MED ORDER — DIPHENHYDRAMINE HCL 25 MG PO CAPS: 25.0000 mg | ORAL_CAPSULE | Freq: Four times a day (QID) | ORAL | Status: DC | PRN

## 2018-03-17 MED ORDER — BENZOCAINE-MENTHOL 20-0.5 % EX AERO
1.0000 "application " | INHALATION_SPRAY | CUTANEOUS | Status: DC | PRN
  Administered 2018-03-17: 1 via TOPICAL
  Filled 2018-03-17: qty 56

## 2018-03-17 MED ORDER — SENNOSIDES-DOCUSATE SODIUM 8.6-50 MG PO TABS
2.0000 | ORAL_TABLET | ORAL | Status: DC
  Administered 2018-03-18: 2 via ORAL
  Filled 2018-03-17: qty 2

## 2018-03-17 MED ORDER — PRENATAL MULTIVITAMIN CH
1.0000 | ORAL_TABLET | Freq: Every day | ORAL | Status: DC
  Administered 2018-03-17: 1 via ORAL
  Filled 2018-03-17 (×2): qty 1

## 2018-03-17 MED ORDER — ACETAMINOPHEN 325 MG PO TABS: 650.0000 mg | ORAL_TABLET | ORAL | Status: DC | PRN

## 2018-03-17 MED ORDER — ZOLPIDEM TARTRATE 5 MG PO TABS: 5.0000 mg | ORAL_TABLET | Freq: Every evening | ORAL | Status: DC | PRN

## 2018-03-17 MED ORDER — COCONUT OIL OIL
1.0000 "application " | TOPICAL_OIL | Status: DC | PRN
  Administered 2018-03-18: 1 via TOPICAL
  Filled 2018-03-17: qty 120

## 2018-03-17 MED ORDER — WITCH HAZEL-GLYCERIN EX PADS: 1.0000 "application " | MEDICATED_PAD | CUTANEOUS | Status: DC | PRN

## 2018-03-17 MED ORDER — ONDANSETRON HCL 4 MG/2ML IJ SOLN: 4.0000 mg | INTRAMUSCULAR | Status: DC | PRN

## 2018-03-17 MED ORDER — DIBUCAINE 1 % RE OINT: 1.0000 "application " | TOPICAL_OINTMENT | RECTAL | Status: DC | PRN

## 2018-03-17 MED ORDER — SIMETHICONE 80 MG PO CHEW: 80.0000 mg | CHEWABLE_TABLET | ORAL | Status: DC | PRN

## 2018-03-17 MED ORDER — IBUPROFEN 600 MG PO TABS
600.0000 mg | ORAL_TABLET | Freq: Four times a day (QID) | ORAL | Status: DC
  Administered 2018-03-17 – 2018-03-18 (×6): 600 mg via ORAL
  Filled 2018-03-17 (×6): qty 1

## 2018-03-17 MED ORDER — TETANUS-DIPHTH-ACELL PERTUSSIS 5-2.5-18.5 LF-MCG/0.5 IM SUSP: 0.5000 mL | Freq: Once | INTRAMUSCULAR | Status: DC

## 2018-03-17 NOTE — Lactation Note (Signed)
This note was copied from a baby's chart. Lactation Consultation Note Baby 4 hrs old. Mom states that baby BF ok, needs upper lip flanged. Baby tucks upper lip in.  Baby has thick upper lip and labial frenulum. W/gloved finger assessed suck, baby tongue thrust finger out. Did finally suckle on finger. Noted upper and nose pulls downwards. Assisted in latching in football position. Upper lip needed flanged out w/finger tip and chin tug. Mom denied painful latch. Heard swallows. Mom has semi flat nipples. Very compressible. Easily expressed 5 ml colostrum. Milk storage discussed. Hand pump given to pre-pump before latching. Shells given. Newborn behavior, STS, I&O, supply and demand discussed.  Mom encouraged to feed baby 8-12 times/24 hours and with feeding cues. Mom encouraged to waken baby for feeds if hasn't cued in 3 hrs.  Encouraged to call for assistance or concerns. [facility] brochure given w/resources, support groups and LC services.  Patient Name: Penny Horne Today's Date: 03/17/2018 Reason for consult: Initial assessment   Maternal Data Has patient been taught Hand Expression?: Yes Does the patient have breastfeeding experience prior to this delivery?: No  Feeding Feeding Type: Breast Fed Length of feed: 20 min  LATCH Score Latch: Grasps breast easily, tongue down, lips flanged, rhythmical sucking.  Audible Swallowing: Spontaneous and intermittent  Type of Nipple: Everted at rest and after stimulation(short shaft)  Comfort (Breast/Nipple): Soft / non-tender  Hold (Positioning): Assistance needed to correctly position infant at breast and maintain latch.  LATCH Score: 9  Interventions Interventions: Breast feeding basics reviewed;Support pillows;Assisted with latch;Position options;Skin to skin;Expressed milk;Breast massage;Hand express;Shells;Pre-pump if needed;Hand pump;Breast compression;Adjust position  Lactation Tools Discussed/Used Tools: Shells;Pump Shell  Type: Inverted Breast pump type: Manual WIC Program: No Pump Review: Setup, frequency, and cleaning;Milk Storage Initiated by:: Peri JeffersonL. Veto Macqueen RN IBCLC Date initiated:: 03/17/18   Consult Status Consult Status: Follow-up Date: 03/18/18 Follow-up type: In-patient    Charyl DancerCARVER, Callin Ashe G 03/17/2018, 5:38 AM

## 2018-03-17 NOTE — Anesthesia Postprocedure Evaluation (Signed)
Anesthesia Post Note  Patient: Penny Horne  Procedure(s) Performed: AN AD HOC LABOR EPIDURAL     Patient location during evaluation: Mother Baby Anesthesia Type: Epidural Level of consciousness: awake and alert and oriented Pain management: satisfactory to patient Vital Signs Assessment: post-procedure vital signs reviewed and stable Respiratory status: spontaneous breathing and nonlabored ventilation Cardiovascular status: stable Postop Assessment: no headache, no backache, no signs of nausea or vomiting, adequate PO intake, patient able to bend at knees and able to ambulate (patient up walking) Anesthetic complications: no    Last Vitals:  Vitals:   03/17/18 0425 03/17/18 0500  BP: 113/72 117/70  Pulse: (!) 102 (!) 106  Resp: 18   Temp: 37.1 C 37.2 C  SpO2: 100%     Last Pain:  Vitals:   03/17/18 0530  TempSrc:   PainSc: 0-No pain   Pain Goal: Patients Stated Pain Goal: 2 (03/16/18 1501)               Madison HickmanGREGORY,Unknown Schleyer

## 2018-03-17 NOTE — Progress Notes (Signed)
PPD 0 SVD-intact / s/p shoulder dystocia  Sleeping - note on door to not disturb   VS: BP 117/70   Pulse (!) 106   Temp 99 F (37.2 C) (Axillary)   Resp 18   Ht 5\' 7"  (1.702 m)   Wt 112.8 kg (248 lb 9.6 oz)   SpO2 100%   Breastfeeding? Unknown   BMI 38.94 kg/m   LABS:              Recent Labs    03/16/18 0850  WBC 7.8  HGB 11.4*  PLT 194               Blood type: --/--/A POS (07/15 0850)  Rubella: Immune (12/14 0000)                     I&O: Intake/Output      07/15 0701 - 07/16 0700 07/16 0701 - 07/17 0700   I.V. (mL/kg) 10.5 (0.1)    Total Intake(mL/kg) 10.5 (0.1)    Urine (mL/kg/hr) 800    Blood 600    Total Output 1400    Net -1389.5         Urine Occurrence  1 x     A: PPD # 0 SVD-intact / s/p shoulder dystocia    P: Routine post partum orders   Marlinda Mikeanya Arlethia Basso CNM, MSN, [facility] 03/17/2018, 10:04 AM

## 2018-03-17 NOTE — Lactation Note (Signed)
This note was copied from a baby's chart. Lactation Consultation Note  Patient Name: Penny Horne Today's Date: 03/17/2018   [facility]  P2 mother whose infant is now 3222 hours old  Mother requesting latch assistance on the left breast.  As I entered mother was trying to awaken baby without success.  Baby was swaddled and at the breast but not sucking.  I suggested unswaddling and doing STS.  Mother accepted help and I took baby out of blanket and awakened gently.  She immediately became active at the breast with a good latch and audible swallows.  Encouraged mother to continue feeding STS with all feeds and that baby may want to feed often tonight.    Mother had no further questions/concerns but will call as needed.  Father present.            Penny Horne Penny Horne 03/17/2018, 11:44 PM

## 2018-03-18 DIAGNOSIS — D649 Anemia, unspecified: Secondary | ICD-10-CM | POA: Diagnosis not present

## 2018-03-18 DIAGNOSIS — D500 Iron deficiency anemia secondary to blood loss (chronic): Secondary | ICD-10-CM | POA: Diagnosis not present

## 2018-03-18 LAB — CBC
HCT: 27.7 % — ABNORMAL LOW (ref 36.0–46.0)
Hemoglobin: 9.4 g/dL — ABNORMAL LOW (ref 12.0–15.0)
MCH: 31.4 pg (ref 26.0–34.0)
MCHC: 33.9 g/dL (ref 30.0–36.0)
MCV: 92.6 fL (ref 78.0–100.0)
Platelets: 194 10*3/uL (ref 150–400)
RBC: 2.99 MIL/uL — ABNORMAL LOW (ref 3.87–5.11)
RDW: 14.4 % (ref 11.5–15.5)
WBC: 14 10*3/uL — ABNORMAL HIGH (ref 4.0–10.5)

## 2018-03-18 MED ORDER — ACETAMINOPHEN 325 MG PO TABS: 650.0000 mg | ORAL_TABLET | ORAL | 1 refills | Status: DC | PRN

## 2018-03-18 MED ORDER — IBUPROFEN 600 MG PO TABS: 600.0000 mg | ORAL_TABLET | Freq: Four times a day (QID) | ORAL | 0 refills | Status: DC

## 2018-03-18 NOTE — Lactation Note (Signed)
This note was copied from a baby's chart. Lactation Consultation Note: Mother reports that infant just finished a 10 min feeding. Infant relaxed and sleeping in mothers arms. Mother reports that she hears infant swallow. She is able to hand express good flow of colostrum. Mother has semi-flat nipples . She is wearing shell. She is able to firm nipple and then to latch infant without difficulty.  Mother has a hand pump and an electric pump.  Advised mother to breastfeed infant 8-12 times in 24 hours. Discussed cluster feeding and cue base feeding.  Discussed treatment and prevention of engorgement.  Mother is aware of all available LC services [facility] and in the community,.  Patient Name: Penny Allegra LaiCandice Tesler ZOXWR'UToday's Date: 03/18/2018 Reason for consult: Follow-up assessment   Maternal Data    Feeding Feeding Type: Breast Fed Length of feed: 10 min  LATCH Score Latch: Grasps breast easily, tongue down, lips flanged, rhythmical sucking.  Audible Swallowing: A few with stimulation  Type of Nipple: Everted at rest and after stimulation  Comfort (Breast/Nipple): Soft / non-tender  Hold (Positioning): Assistance needed to correctly position infant at breast and maintain latch.  LATCH Score: 8  Interventions Interventions: Hand express;Breast compression;Expressed milk;Shells;Hand pump  Lactation Tools Discussed/Used     Consult Status Consult Status: Complete    Michel BickersKendrick, Mele Sylvester McCoy 03/18/2018, 11:41 AM

## 2018-03-18 NOTE — Progress Notes (Deleted)
Pt requested IV to be discontinued.  Pt educated that she still has IV medications during the day.  Pt still requests that IV be removed.

## 2018-03-18 NOTE — Discharge Summary (Signed)
  Obstetric Discharge Summary  Patient ID: Penny Horne MRN: [id number] DOB/AGE: 1980-10-29 37 y.o.   Date of Admission: 03/16/2018  Date of Discharge:  03/18/18  Admitting Diagnosis: Induction of labor at 5764w1d  Secondary Diagnosis: Blood loss anemia, Shoulder dystocia, EBL: 600 ml  Mode of Delivery: normal spontaneous vaginal delivery     Discharge Diagnosis: No other diagnosis   Intrapartum Procedures: Atificial rupture of membranes, epidural and pitocin augmentation   Post partum procedures: None  Complications: None   Brief Hospital Course   Penny Horne is a [id number] who had a SVD on 03/17/2018;  for further details of this birth, please refer to the delivey note.  Patient had an uncomplicated postpartum course.  By time of discharge on PPD#1, her pain was controlled on oral pain medications; she had appropriate lochia and was ambulating, voiding without difficulty and tolerating regular diet.  She was deemed stable for discharge to home.    Labs:  CBC Latest Ref Rng & Units 03/18/2018 03/16/2018 03/25/2017  WBC 4.0 - 10.5 K/uL 14.0(H) 7.8 -  Hemoglobin 12.0 - 15.0 g/dL 1.4(N9.4(L) 11.4(L) 10.5(L)  Hematocrit 36.0 - 46.0 % 27.7(L) 33.6(L) -  Platelets 150 - 400 K/uL 194 194 -   A POS  Physical exam:   Temp:  [97.5 F (36.4 C)-98.2 F (36.8 C)] 97.5 F (36.4 C) (07/17 0615) Pulse Rate:  [78-92] 86 (07/17 0615) Resp:  [15-18] 18 (07/17 0615) BP: (120-125)/(67-71) 123/67 (07/17 0615) SpO2:  [100 %] 100 % (07/17 0615)  General: alert and no distress  Lochia: appropriate  Abdomen: soft, NT  Uterine Fundus: firm  Perineum:  no significant erythema  Extremities: No evidence of DVT seen on physical exam.   Discharge Instructions: Per After Visit Summary.  Activity: Advance as tolerated. Pelvic rest for 6 weeks.  Also refer to After Visit Summary  Diet: Regular  Medications: Allergies as of 03/18/2018   No Known Allergies     Medication List    STOP  taking these medications   ondansetron 4 MG tablet Commonly known as:  ZOFRAN   oxyCODONE-acetaminophen 7.5-325 MG tablet Commonly known as:  PERCOCET     TAKE these medications   acetaminophen 325 MG tablet Commonly known as:  TYLENOL Take 2 tablets (650 mg total) by mouth every 4 (four) hours as needed (for pain scale < 4).   ibuprofen 600 MG tablet Commonly known as:  ADVIL,MOTRIN Take 1 tablet (600 mg total) by mouth every 6 (six) hours.   IRON 27 PO Take 27 mg by mouth daily.   prenatal multivitamin Tabs tablet Take 1 tablet by mouth daily at 12 noon.      Outpatient follow up:  Follow-up Information    Shea EvansMody, Vaishali, MD Follow up.   Specialty:  Obstetrics and Gynecology Why:  The office will call you to schedule a six (6) week postpartum appointment with Dr. Talbert ForestMody Contact information: [address] [place] [address] [phone]           Discharged Condition: stable  Discharged to: home   Newborn Data:  Disposition:home with mother  Apgars: APGAR (1 MIN): 8   APGAR (5 MINS): 9    Baby Feeding: Breast   Gunnar BullaJenkins Michelle Lawhorn, CNM [facility] 03/18/18 11:43 AM

## 2018-03-18 NOTE — Discharge Instructions (Signed)
Breastfeeding Choosing to breastfeed is one of the best decisions you can make for yourself and your baby. A change in hormones during pregnancy causes your breasts to make breast milk in your milk-producing glands. Hormones prevent breast milk from being released before your baby is born. They also prompt milk flow after birth. Once breastfeeding has begun, thoughts of your baby, as well as his or her sucking or crying, can stimulate the release of milk from your milk-producing glands. Benefits of breastfeeding Research shows that breastfeeding offers many health benefits for infants and mothers. It also offers a cost-free and convenient way to feed your baby. For your baby  Your first milk (colostrum) helps your baby's digestive system to function better.  Special cells in your milk (antibodies) help your baby to fight off infections.  Breastfed babies are less likely to develop asthma, allergies, obesity, or type 2 diabetes. They are also at lower risk for sudden infant death syndrome (SIDS).  Nutrients in breast milk are better able to meet your babys needs compared to infant formula.  Breast milk improves your baby's brain development. For you  Breastfeeding helps to create a very special bond between you and your baby.  Breastfeeding is convenient. Breast milk costs nothing and is always available at the correct temperature.  Breastfeeding helps to burn calories. It helps you to lose the weight that you gained during pregnancy.  Breastfeeding makes your uterus return faster to its size before pregnancy. It also slows bleeding (lochia) after you give birth.  Breastfeeding helps to lower your risk of developing type 2 diabetes, osteoporosis, rheumatoid arthritis, cardiovascular disease, and breast, ovarian, uterine, and endometrial cancer later in life. Breastfeeding basics Starting breastfeeding  Find a comfortable place to sit or lie down, with your neck and back  well-supported.  Place a pillow or a rolled-up blanket under your baby to bring him or her to the level of your breast (if you are seated). Nursing pillows are specially designed to help support your arms and your baby while you breastfeed.  Make sure that your baby's tummy (abdomen) is facing your abdomen.  Gently massage your breast. With your fingertips, massage from the outer edges of your breast inward toward the nipple. This encourages milk flow. If your milk flows slowly, you may need to continue this action during the feeding.  Support your breast with 4 fingers underneath and your thumb above your nipple (make the letter "C" with your hand). Make sure your fingers are well away from your nipple and your babys mouth.  Stroke your baby's lips gently with your finger or nipple.  When your baby's mouth is open wide enough, quickly bring your baby to your breast, placing your entire nipple and as much of the areola as possible into your baby's mouth. The areola is the colored area around your nipple. ? More areola should be visible above your baby's upper lip than below the lower lip. ? Your baby's lips should be opened and extended outward (flanged) to ensure an adequate, comfortable latch. ? Your baby's tongue should be between his or her lower gum and your breast.  Make sure that your baby's mouth is correctly positioned around your nipple (latched). Your baby's lips should create a seal on your breast and be turned out (everted).  It is common for your baby to suck about 2-3 minutes in order to start the flow of breast milk. Latching Teaching your baby how to latch onto your breast properly is very  important. An improper latch can cause nipple pain, decreased milk supply, and poor weight gain in your baby. Also, if your baby is not latched onto your nipple properly, he or she may swallow some air during feeding. This can make your baby fussy. Burping your baby when you switch breasts  during the feeding can help to get rid of the air. However, teaching your baby to latch on properly is still the best way to prevent fussiness from swallowing air while breastfeeding. °Signs that your baby has successfully latched onto your nipple °· Silent tugging or silent sucking, without causing you pain. Infant's lips should be extended outward (flanged). °· Swallowing heard between every 3-4 sucks once your milk has started to flow (after your let-down milk reflex occurs). °· Muscle movement above and in front of his or her ears while sucking. ° °Signs that your baby has not successfully latched onto your nipple °· Sucking sounds or smacking sounds from your baby while breastfeeding. °· Nipple pain. ° °If you think your baby has not latched on correctly, slip your finger into the corner of your baby’s mouth to break the suction and place it between your baby's gums. Attempt to start breastfeeding again. °Signs of successful breastfeeding °Signs from your baby °· Your baby will gradually decrease the number of sucks or will completely stop sucking. °· Your baby will fall asleep. °· Your baby's body will relax. °· Your baby will retain a small amount of milk in his or her mouth. °· Your baby will let go of your breast by himself or herself. ° °Signs from you °· Breasts that have increased in firmness, weight, and size 1-3 hours after feeding. °· Breasts that are softer immediately after breastfeeding. °· Increased milk volume, as well as a change in milk consistency and color by the fifth day of breastfeeding. °· Nipples that are not sore, cracked, or bleeding. ° °Signs that your baby is getting enough milk °· Wetting at least 1-2 diapers during the first 24 hours after birth. °· Wetting at least 5-6 diapers every 24 hours for the first week after birth. The urine should be clear or pale yellow by the age of 5 days. °· Wetting 6-8 diapers every 24 hours as your baby continues to grow and develop. °· At least 3  stools in a 24-hour period by the age of 5 days. The stool should be soft and yellow. °· At least 3 stools in a 24-hour period by the age of 7 days. The stool should be seedy and yellow. °· No loss of weight greater than 10% of birth weight during the first 3 days of life. °· Average weight gain of 4-7 oz (113-198 g) per week after the age of 4 days. °· Consistent daily weight gain by the age of 5 days, without weight loss after the age of 2 weeks. °After a feeding, your baby may spit up a small amount of milk. This is normal. °Breastfeeding frequency and duration °Frequent feeding will help you make more milk and can prevent sore nipples and extremely full breasts (breast engorgement). Breastfeed when you feel the need to reduce the fullness of your breasts or when your baby shows signs of hunger. This is called "breastfeeding on demand." Signs that your baby is hungry include: °· Increased alertness, activity, or restlessness. °· Movement of the head from side to side. °· Opening of the mouth when the corner of the mouth or cheek is stroked (rooting). °· Increased sucking sounds,   smacking lips, cooing, sighing, or squeaking. °· Hand-to-mouth movements and sucking on fingers or hands. °· Fussing or crying. ° °Avoid introducing a pacifier to your baby in the first 4-6 weeks after your baby is born. After this time, you may choose to use a pacifier. Research has shown that pacifier use during the first year of a baby's life decreases the risk of sudden infant death syndrome (SIDS). °Allow your baby to feed on each breast as long as he or she wants. When your baby unlatches or falls asleep while feeding from the first breast, offer the second breast. Because newborns are often sleepy in the first few weeks of life, you may need to awaken your baby to get him or her to feed. °Breastfeeding times will vary from baby to baby. However, the following rules can serve as a guide to help you make sure that your baby is  properly fed: °· Newborns (babies 4 weeks of age or younger) may breastfeed every 1-3 hours. °· Newborns should not go without breastfeeding for longer than 3 hours during the day or 5 hours during the night. °· You should breastfeed your baby a minimum of 8 times in a 24-hour period. ° °Breast milk pumping °Pumping and storing breast milk allows you to make sure that your baby is exclusively fed your breast milk, even at times when you are unable to breastfeed. This is especially important if you go back to work while you are still breastfeeding, or if you are not able to be present during feedings. Your lactation consultant can help you find a method of pumping that works best for you and give you guidelines about how long it is safe to store breast milk. °Caring for your breasts while you breastfeed °Nipples can become dry, cracked, and sore while breastfeeding. The following recommendations can help keep your breasts moisturized and healthy: °· Avoid using soap on your nipples. °· Wear a supportive bra designed especially for nursing. Avoid wearing underwire-style bras or extremely tight bras (sports bras). °· Air-dry your nipples for 3-4 minutes after each feeding. °· Use only cotton bra pads to absorb leaked breast milk. Leaking of breast milk between feedings is normal. °· Use lanolin on your nipples after breastfeeding. Lanolin helps to maintain your skin's normal moisture barrier. Pure lanolin is not harmful (not toxic) to your baby. You may also hand express a few drops of breast milk and gently massage that milk into your nipples and allow the milk to air-dry. ° °In the first few weeks after giving birth, some women experience breast engorgement. Engorgement can make your breasts feel heavy, warm, and tender to the touch. Engorgement peaks within 3-5 days after you give birth. The following recommendations can help to ease engorgement: °· Completely empty your breasts while breastfeeding or pumping. You  may want to start by applying warm, moist heat (in the shower or with warm, water-soaked hand towels) just before feeding or pumping. This increases circulation and helps the milk flow. If your baby does not completely empty your breasts while breastfeeding, pump any extra milk after he or she is finished. °· Apply ice packs to your breasts immediately after breastfeeding or pumping, unless this is too uncomfortable for you. To do this: °? Put ice in a plastic bag. °? Place a towel between your skin and the bag. °? Leave the ice on for 20 minutes, 2-3 times a day. °· Make sure that your baby is latched on and positioned properly while   breastfeeding.  If engorgement persists after 48 hours of following these recommendations, contact your health care provider or a Advertising copywriterlactation consultant. Overall health care recommendations while breastfeeding  Eat 3 healthy meals and 3 snacks every day. Well-nourished mothers who are breastfeeding need an additional 450-500 calories a day. You can meet this requirement by increasing the amount of a balanced diet that you eat.  Drink enough water to keep your urine pale yellow or clear.  Rest often, relax, and continue to take your prenatal vitamins to prevent fatigue, stress, and low vitamin and mineral levels in your body (nutrient deficiencies).  Do not use any products that contain nicotine or tobacco, such as cigarettes and e-cigarettes. Your baby may be harmed by chemicals from cigarettes that pass into breast milk and exposure to secondhand smoke. If you need help quitting, ask your health care provider.  Avoid alcohol.  Do not use illegal drugs or marijuana.  Talk with your health care provider before taking any medicines. These include over-the-counter and prescription medicines as well as vitamins and herbal supplements. Some medicines that may be harmful to your baby can pass through breast milk.  It is possible to become pregnant while breastfeeding. If  birth control is desired, ask your health care provider about options that will be safe while breastfeeding your baby. Where to find more information: [employer]: www.llli.org Contact a health care provider if:  You feel like you want to stop breastfeeding or have become frustrated with breastfeeding.  Your nipples are cracked or bleeding.  Your breasts are red, tender, or warm.  You have: ? Painful breasts or nipples. ? A swollen area on either breast. ? A fever or chills. ? Nausea or vomiting. ? Drainage other than breast milk from your nipples.  Your breasts do not become full before feedings by the fifth day after you give birth.  You feel sad and depressed.  Your baby is: ? Too sleepy to eat well. ? Having trouble sleeping. ? More than 1081 week old and wetting fewer than 6 diapers in a 24-hour period. ? Not gaining weight by 755 days of age.  Your baby has fewer than 3 stools in a 24-hour period.  Your baby's skin or the white parts of his or her eyes become yellow. Get help right away if:  Your baby is overly tired (lethargic) and does not want to wake up and feed.  Your baby develops an unexplained fever. Summary  Breastfeeding offers many health benefits for infant and mothers.  Try to breastfeed your infant when he or she shows early signs of hunger.  Gently tickle or stroke your baby's lips with your finger or nipple to allow the baby to open his or her mouth. Bring the baby to your breast. Make sure that much of the areola is in your baby's mouth. Offer one side and burp the baby before you offer the other side.  Talk with your health care provider or lactation consultant if you have questions or you face problems as you breastfeed. This information is not intended to replace advice given to you by your health care provider. Make sure you discuss any questions you have with your health care provider. Document Released: 08/19/2005 Document  Revised: 09/20/2016 Document Reviewed: 09/20/2016 Elsevier Interactive Patient Education  2018 [employer] Home Care Instructions for Mom ACTIVITY  Gradually return to your regular activities.  Let yourself rest. Nap while your baby sleeps.  Avoid lifting anything that is heavier  than 10 lb (4.5 kg) until your health care provider says it is okay.  Avoid activities that take a lot of effort and energy (are strenuous) until approved by your health care provider. Walking at a slow-to-moderate pace is usually safe.  If you had a cesarean delivery: ? Do not vacuum, climb stairs, or drive a car for 4-6 weeks. ? Have someone help you at home until you feel like you can do your usual activities yourself. ? Do exercises as told by your health care provider, if this applies.  VAGINAL BLEEDING You may continue to bleed for 4-6 weeks after delivery. Over time, the amount of blood usually decreases and the color of the blood usually gets lighter. However, the flow of bright red blood may increase if you have been too active. If you need to use more than one pad in an hour because your pad gets soaked, or if you pass a large clot:  Lie down.  Raise your feet.  Place a cold compress on your lower abdomen.  Rest.  Call your health care provider.  If you are breastfeeding, your period should return anytime between 8 weeks after delivery and the time that you stop breastfeeding. If you are not breastfeeding, your period should return 6-8 weeks after delivery. PERINEAL CARE The perineal area, or perineum, is the part of your body between your thighs. After delivery, this area needs special care. Follow these instructions as told by your health care provider.  Take warm tub baths for 15-20 minutes.  Use medicated pads and pain-relieving sprays and creams as told.  Do not use tampons or douches until vaginal bleeding has stopped.  Each time you go to the bathroom: ? Use a peri  bottle. ? Change your pad. ? Use towelettes in place of toilet paper until your stitches have healed.  Do Kegel exercises every day. Kegel exercises help to maintain the muscles that support the vagina, bladder, and bowels. You can do these exercises while you are standing, sitting, or lying down. To do Kegel exercises: ? Tighten the muscles of your abdomen and the muscles that surround your birth canal. ? Hold for a few seconds. ? Relax. ? Repeat until you have done this 5 times in a row.  To prevent hemorrhoids from developing or getting worse: ? Drink enough fluid to keep your urine clear or pale yellow. ? Avoid straining when having a bowel movement. ? Take over-the-counter medicines and stool softeners as told by your health care provider.  BREAST CARE  Wear a tight-fitting bra.  Avoid taking over-the-counter pain medicine for breast discomfort.  Apply ice to the breasts to help with discomfort as needed: ? Put ice in a plastic bag. ? Place a towel between your skin and the bag. ? Leave the ice on for 20 minutes or as told by your health care provider.  NUTRITION  Eat a well-balanced diet.  Do not try to lose weight quickly by cutting back on calories.  Take your prenatal vitamins until your postpartum checkup or until your health care provider tells you to stop.  POSTPARTUM DEPRESSION You may find yourself crying for no apparent reason and unable to cope with all of the changes that come with having a newborn. This mood is called postpartum depression. Postpartum depression happens because your hormone levels change after delivery. If you have postpartum depression, get support from your partner, friends, and family. If the depression does not go away on its own after several  weeks, contact your health care provider. BREAST SELF-EXAM Do a breast self-exam each month, at the same time of the month. If you are breastfeeding, check your breasts just after a feeding, when your  breasts are less full. If you are breastfeeding and your period has started, check your breasts on day 5, 6, or 7 of your period. Report any lumps, bumps, or discharge to your health care provider. Know that breasts are normally lumpy if you are breastfeeding. This is temporary, and it is not a health risk. INTIMACY AND SEXUALITY Avoid sexual activity for at least 3-4 weeks after delivery or until the brownish-red vaginal flow is completely gone. If you want to avoid pregnancy, use some form of birth control. You can get pregnant after delivery, even if you have not had your period. SEEK MEDICAL CARE IF:  You feel unable to cope with the changes that a child brings to your life, and these feelings do not go away after several weeks.  You notice a lump, a bump, or discharge on your breast.  SEEK IMMEDIATE MEDICAL CARE IF:  Blood soaks your pad in 1 hour or less.  You have: ? Severe pain or cramping in your lower abdomen. ? A bad-smelling vaginal discharge. ? A fever that is not controlled by medicine. ? A fever, and an area of your breast is red and sore. ? Pain or redness in your calf. ? Sudden, severe chest pain. ? Shortness of breath. ? Painful or bloody urination. ? Problems with your vision.  You vomit for 12 hours or longer.  You develop a severe headache.  You have serious thoughts about hurting yourself, your child, or anyone else.  This information is not intended to replace advice given to you by your health care provider. Make sure you discuss any questions you have with your health care provider. Document Released: 08/16/2000 Document Revised: 01/25/2016 Document Reviewed: 02/20/2015 Elsevier Interactive Patient Education  2017 [employer]. Breastfeeding Challenges and Solutions Even though breastfeeding is natural, it can be challenging, especially in the first few weeks after childbirth. It is normal for problems to arise when starting to breastfeed your new baby,  even if you have breastfed before. This document provides some solutions to the most common breastfeeding challenges. Challenges and solutions Challenge--Cracked or Sore Nipples Cracked or sore nipples are commonly experienced by breastfeeding mothers. Cracked or sore nipples often are caused by inadequate latching (when your baby's mouth attaches to your breast to breastfeed). Soreness can also happen if your baby is not positioned properly at your breast. Although nipple cracking and soreness are common during the first week after birth, nipple pain is never normal. If you experience nipple cracking or soreness that lasts longer than 1 week or nipple pain, call your health care provider or lactation consultant. Solution Ensure proper latching and positioning of your baby by following the steps below:  Find a comfortable place to sit or lie down, with your neck and back well supported.  Place a pillow or rolled up blanket under your baby to bring him or her to the level of your breast (if you are seated).  Make sure that your baby's abdomen is facing your abdomen.  Gently massage your breast. With your fingertips, massage from your chest wall toward your nipple in a circular motion. This encourages milk flow. You may need to continue this action during the feeding if your milk flows slowly.  Support your breast with 4 fingers underneath and your thumb  above your nipple. Make sure your fingers are well away from your nipple and your babys mouth.  Stroke your baby's lips gently with your finger or nipple.  When your baby's mouth is open wide enough, quickly bring your baby to your breast, placing your entire nipple and as much of the colored area around your nipple (areola) as possible into your baby's mouth. ? More areola should be visible above your baby's upper lip than below the lower lip. ? Your baby's tongue should be between his or her lower gum and your breast.  Ensure that your baby's  mouth is correctly positioned around your nipple (latched). Your baby's lips should create a seal on your breast and be turned out (everted).  It is common for your baby to suck for about 2-3 minutes in order to start the flow of breast milk.  Signs that your baby has successfully latched on to your nipple include:  Quietly tugging or quietly sucking without causing you pain.  Swallowing heard between every 3-4 sucks.  Muscle movement above and in front of his or her ears with sucking.  Signs that your baby has not successfully latched on to nipple include:  Sucking sounds or smacking sounds from your baby while nursing.  Nipple pain.  Ensure that your breasts stay moisturized and healthy by:  Avoiding the use of soap on your nipples.  Wearing a supportive bra. Avoid wearing underwire-style bras or tight bras.  Air drying your nipples for 3-4 minutes after each feeding.  Using only cotton bra pads to absorb breast milk leakage. Leaking of breast milk between feedings is normal. Be sure to change the pads if they become soaked with milk.  Using lanolin on your nipples after nursing. Lanolin helps to maintain your skin's normal moisture barrier. If you use pure lanolin you do not need to wash it off before feeding your baby again. Pure lanolin is not toxic to your baby. You may also hand express a few drops of breast milk and gently massage that milk into your nipples, allowing it to air dry.  Challenge--Breast Engorgement Breast engorgement is the overfilling of your breasts with breast milk. In the first few weeks after giving birth, you may experience breast engorgement. Breast engorgement can make your breasts throb and feel hard, tightly stretched, warm, and tender. Engorgement peaks about the fifth day after you give birth. Having breast engorgement does not mean you have to stop breastfeeding your baby. Solution  Breastfeed when you feel the need to reduce the fullness of your  breasts or when your baby shows signs of hunger. This is called "breastfeeding on demand."  Newborns (babies younger than 4 weeks) often breastfeed every 1-3 hours during the day. You may need to awaken your baby to feed if he or she is asleep at a feeding time.  Do not allow your baby to sleep longer than 5 hours during the night without a feeding.  Pump or hand express breast milk before breastfeeding to soften your breast, areola, and nipple.  Apply warm, moist heat (in the shower or with warm water-soaked hand towels) just before feeding or pumping, or massage your breast before or during breastfeeding. This increases circulation and helps your milk to flow.  Completely empty your breasts when breastfeeding or pumping. Afterward, wear a snug bra (nursing or regular) or tank top for 1-2 days to signal your body to slightly decrease milk production. Only wear snug bras or tank tops to treat engorgement. Tight  bras typically should be avoided by breastfeeding mothers. Once engorgement is relieved, return to wearing regular, loose-fitting clothes.  Apply ice packs to your breasts to lessen the pain from engorgement and relieve swelling, unless the ice is uncomfortable for you.  Do not delay feedings. Try to relax when it is time to feed your baby. This helps to trigger your "let-down reflex," which releases milk from your breast.  Ensure your baby is latched on to your breast and positioned properly while breastfeeding.  Allow your baby to remain at your breast as long as he or she is latched on well and actively sucking. Your baby will let you know when he or she is done breastfeeding by pulling away from your breast or falling asleep.  Avoid introducing bottles or pacifiers to your baby in the early weeks of breastfeeding. Wait to introduce these things until after resolving any breastfeeding challenges.  Try to pump your milk on the same schedule as when your baby would breastfeed if you are  returning to work or away from home for an extended period.  Drink plenty of fluids to avoid dehydration, which can eventually put you at greater risk of breast engorgement.  If you follow these suggestions, your engorgement should improve in 24-48 hours. If you are still experiencing difficulty, call your lactation consultant or health care provider. Challenge--Plugged Milk Ducts Plugged milk ducts occur when the duct does not drain milk effectively and becomes swollen. Wearing a tight-fitting nursing bra or having difficulty with latching may cause plugged milk ducts. Not drinking enough water (8-10 c [1.9-2.4 L] per day) can contribute to plugged milk ducts. Once a duct has become plugged, hard lumps, soreness, and redness may develop in your breast. Solution Do not delay feedings. Feed your baby frequently and try to empty your breasts of milk at each feeding. Try breastfeeding from the affected side first so there is a better chance that the milk will drain completely from that breast. Apply warm, moist towels to your breasts for 5-10 minutes before feeding. Alternatively, a hot shower right before breastfeeding can provide the moist heat that can encourage milk flow. Gentle massage of the sore area before and during a feeding may also help. Avoid wearing tight clothing or bras that put pressure on your breasts. Wear bras that offer good support to your breasts, but avoid underwire bras. If you have a plugged milk duct and develop a fever, you need to see your health care provider. Challenge--Mastitis Mastitis is inflammation of your breast. It usually is caused by a bacterial infection and can cause flu-like symptoms. You may develop redness in your breast and a fever. Often when mastitis occurs, your breast becomes firm, warm, and very painful. The most common causes of mastitis are poor latching, ineffective sucking from your baby, consistent pressure on your breast (possibly from wearing a  tight-fitting bra or shirt that restricts the milk flow), unusual stress or fatigue, or missed feedings. Solution You will be given antibiotic medicine to treat the infection. It is still important to breastfeed frequently to empty your breasts. Continuing to breastfeed while you recover from mastitis will not harm your baby. Make sure your baby is positioned properly during every feeding. Apply moist heat to your breasts for a few minutes before feeding to help the milk flow and to help your breasts empty more easily. Challenge--Thrush Ginette Pitmanhrush is a yeast infection that can form on your nipples, in your breast, or in your baby's mouth. It causes  itching, soreness, burning or stabbing pain, and sometimes a rash. Solution You will be given a medicated ointment for your nipples, and your baby will be given a liquid medicine for his or her mouth. It is important that you and your baby are treated at the same time because thrush can be passed between you and your baby. Change disposable nursing pads often. Any bras, towels, or clothing that come in contact with infected areas of your body or your baby's body need to be washed in very hot water every day. Wash your hands and your baby's hands often. All pacifiers, bottle nipples, or toys your baby puts in his or her mouth should be boiled once a day for 20 minutes. After 1 week of treatment, discard pacifiers and bottle nipples and buy new ones. All breast pump parts that touch the milk need to be boiled for 20 minutes every day. Challenge--Low Milk Supply You may not be producing enough milk if your baby is not gaining the proper amount of weight. Breast milk production is based on a supply-and-demand system. Your milk supply depends on how frequently and effectively your baby empties your breast. Solution The more you breastfeed and pump, the more breast milk you will produce. It is important that your baby empties at least one of your breasts at each feeding.  If this is not happening, then use a breast pump or hand express any milk that remains. This will help to drain as much milk as possible at each feeding. It will also signal your body to produce more milk. If your baby is not emptying your breasts, it may be due to latching, sucking, or positioning problems. If low milk supply continues after addressing these issues, contact your health care provider or a lactation specialist as soon as possible. Challenge--Inverted or Flat Nipples Some women have nipples that turn inward instead of protruding outward. Other women have nipples that are flat. Inverted or flat nipples can sometimes make it more difficult for your baby to latch onto your breast. Solution You may be given a small device that pulls out inverted nipples. This device should be applied right before your baby is brought to your breast. You can also try using a breast pump for a short time before placing the baby at your breast. The pump can pull your nipple outwards to help your infant latch more easily. The baby's sucking motion will help the inverted nipple protrude as well. If you have flat nipples, encourage your baby to latch onto your breast and feed frequently in the early days after birth. This will give your baby practice latching on correctly while your breast is still soft. When your milk supply increases, between the second and fifth day after birth and your breasts become full, your baby will have an easier time latching. Contact a lactation consultant if you still have concerns. She or he can teach you additional techniques to address breastfeeding problems related to nipple shape and position. Where to find more information: [employer]: www.llli.org This information is not intended to replace advice given to you by your health care provider. Make sure you discuss any questions you have with your health care provider. Document Released: 02/10/2006 Document Revised:  01/31/2016 Document Reviewed: 02/12/2013 Elsevier Interactive Patient Education  2017 [employer] Postpartum Care After Vaginal Delivery The period of time right after you deliver your newborn is called the postpartum period. What kind of medical care will I receive?  You may  continue to receive fluids and medicines through an IV tube inserted into one of your veins.  If an incision was made near your vagina (episiotomy) or if you had some vaginal tearing during delivery, cold compresses may be placed on your episiotomy or your tear. This helps to reduce pain and swelling.  You may be given a squirt bottle to use when you go to the bathroom. You may use this until you are comfortable wiping as usual. To use the squirt bottle, follow these steps: ? Before you urinate, fill the squirt bottle with warm water. Do not use hot water. ? After you urinate, while you are sitting on the toilet, use the squirt bottle to rinse the area around your urethra and vaginal opening. This rinses away any urine and blood. ? You may do this instead of wiping. As you start healing, you may use the squirt bottle before wiping yourself. Make sure to wipe gently. ? Fill the squirt bottle with clean water every time you use the bathroom.  You will be given sanitary pads to wear. How can I expect to feel?  You may not feel the need to urinate for several hours after delivery.  You will have some soreness and pain in your abdomen and vagina.  If you are breastfeeding, you may have uterine contractions every time you breastfeed for up to several weeks postpartum. Uterine contractions help your uterus return to its normal size.  It is normal to have vaginal bleeding (lochia) after delivery. The amount and appearance of lochia is often similar to a menstrual period in the first week after delivery. It will gradually decrease over the next few weeks to a dry, yellow-brown discharge. For most women, lochia stops  completely by 6-8 weeks after delivery. Vaginal bleeding can vary from woman to woman.  Within the first few days after delivery, you may have breast engorgement. This is when your breasts feel heavy, full, and uncomfortable. Your breasts may also throb and feel hard, tightly stretched, warm, and tender. After this occurs, you may have milk leaking from your breasts.Your health care provider can help you relieve discomfort due to breast engorgement. Breast engorgement should go away within a few days.  You may feel more sad or worried than normal due to hormonal changes after delivery. These feelings should not last more than a few days. If these feelings do not go away after several days, speak with your health care provider. How should I care for myself?  Tell your health care provider if you have pain or discomfort.  Drink enough water to keep your urine clear or pale yellow.  Wash your hands thoroughly with soap and water for at least 20 seconds after changing your sanitary pads, after using the toilet, and before holding or feeding your baby.  If you are not breastfeeding, avoid touching your breasts a lot. Doing this can make your breasts produce more milk.  If you become weak or lightheaded, or you feel like you might faint, ask for help before: ? Getting out of bed. ? Showering.  Change your sanitary pads frequently. Watch for any changes in your flow, such as a sudden increase in volume, a change in color, the passing of large blood clots. If you pass a blood clot from your vagina, save it to show to your health care provider. Do not flush blood clots down the toilet without having your health care provider look at them.  Make sure that all your vaccinations  are up to date. This can help protect you and your baby from getting certain diseases. You may need to have immunizations done before you leave the hospital.  If desired, talk with your health care provider about methods of  family planning or birth control (contraception). How can I start bonding with my baby? Spending as much time as possible with your baby is very important. During this time, you and your baby can get to know each other and develop a bond. Having your baby stay with you in your room (rooming in) can give you time to get to know your baby. Rooming in can also help you become comfortable caring for your baby. Breastfeeding can also help you bond with your baby. How can I plan for returning home with my baby?  Make sure that you have a car seat installed in your vehicle. ? Your car seat should be checked by a certified car seat installer to make sure that it is installed safely. ? Make sure that your baby fits into the car seat safely.  Ask your health care provider any questions you have about caring for yourself or your baby. Make sure that you are able to contact your health care provider with any questions after leaving the hospital. This information is not intended to replace advice given to you by your health care provider. Make sure you discuss any questions you have with your health care provider. Document Released: 06/16/2007 Document Revised: 01/22/2016 Document Reviewed: 07/24/2015 Elsevier Interactive Patient Education  2018 [employer]. Postpartum Depression and Baby Blues The postpartum period begins right after the birth of a baby. During this time, there is often a great amount of joy and excitement. It is also a time of many changes in the life of the parents. Regardless of how many times a mother gives birth, each child brings new challenges and dynamics to the family. It is not unusual to have feelings of excitement along with confusing shifts in moods, emotions, and thoughts. All mothers are at risk of developing postpartum depression or the "baby blues." These mood changes can occur right after giving birth, or they may occur many months after giving birth. The baby blues or  postpartum depression can be mild or severe. Additionally, postpartum depression can go away rather quickly, or it can be a long-term condition. What are the causes? Raised hormone levels and the rapid drop in those levels are thought to be a main cause of postpartum depression and the baby blues. A number of hormones change during and after pregnancy. Estrogen and progesterone usually decrease right after the delivery of your baby. The levels of thyroid hormone and various cortisol steroids also rapidly drop. Other factors that play a role in these mood changes include major life events and genetics. What increases the risk? If you have any of the following risks for the baby blues or postpartum depression, know what symptoms to watch out for during the postpartum period. Risk factors that may increase the likelihood of getting the baby blues or postpartum depression include:  Having a personal or family history of depression.  Having depression while being pregnant.  Having premenstrual mood issues or mood issues related to oral contraceptives.  Having a lot of life stress.  Having marital conflict.  Lacking a social support network.  Having a baby with special needs.  Having health problems, such as diabetes.  What are the signs or symptoms? Symptoms of baby blues include:  Brief changes in mood,  such as going from extreme happiness to sadness.  Decreased concentration.  Difficulty sleeping.  Crying spells, tearfulness.  Irritability.  Anxiety.  Symptoms of postpartum depression typically begin within the first month after giving birth. These symptoms include:  Difficulty sleeping or excessive sleepiness.  Marked weight loss.  Agitation.  Feelings of worthlessness.  Lack of interest in activity or food.  Postpartum psychosis is a very serious condition and can be dangerous. Fortunately, it is rare. Displaying any of the following symptoms is cause for immediate  medical attention. Symptoms of postpartum psychosis include:  Hallucinations and delusions.  Bizarre or disorganized behavior.  Confusion or disorientation.  How is this diagnosed? A diagnosis is made by an evaluation of your symptoms. There are no medical or lab tests that lead to a diagnosis, but there are various questionnaires that a health care provider may use to identify those with the baby blues, postpartum depression, or psychosis. Often, a screening tool called the [place] Postnatal Depression Scale is used to diagnose depression in the postpartum period. How is this treated? The baby blues usually goes away on its own in 1-2 weeks. Social support is often all that is needed. You will be encouraged to get adequate sleep and rest. Occasionally, you may be given medicines to help you sleep. Postpartum depression requires treatment because it can last several months or longer if it is not treated. Treatment may include individual or group therapy, medicine, or both to address any social, physiological, and psychological factors that may play a role in the depression. Regular exercise, a healthy diet, rest, and social support may also be strongly recommended. Postpartum psychosis is more serious and needs treatment right away. Hospitalization is often needed. Follow these instructions at home:  Get as much rest as you can. Nap when the baby sleeps.  Exercise regularly. Some women find yoga and walking to be beneficial.  Eat a balanced and nourishing diet.  Do little things that you enjoy. Have a cup of tea, take a bubble bath, read your favorite magazine, or listen to your favorite music.  Avoid alcohol.  Ask for help with household chores, cooking, grocery shopping, or running errands as needed. Do not try to do everything.  Talk to people close to you about how you are feeling. Get support from your partner, family members, friends, or other new moms.  Try to stay positive in  how you think. Think about the things you are grateful for.  Do not spend a lot of time alone.  Only take over-the-counter or prescription medicine as directed by your health care provider.  Keep all your postpartum appointments.  Let your health care provider know if you have any concerns. Contact a health care provider if: You are having a reaction to or problems with your medicine. Get help right away if:  You have suicidal feelings.  You think you may harm the baby or someone else. This information is not intended to replace advice given to you by your health care provider. Make sure you discuss any questions you have with your health care provider. Document Released: 05/23/2004 Document Revised: 01/25/2016 Document Reviewed: 05/31/2013 Elsevier Interactive Patient Education  2017 [employer].

## 2018-04-01 ENCOUNTER — Other Ambulatory Visit: Payer: Self-pay | Admitting: Certified Nurse Midwife

## 2019-11-11 DIAGNOSIS — M65871 Other synovitis and tenosynovitis, right ankle and foot: Secondary | ICD-10-CM | POA: Diagnosis not present

## 2019-11-11 DIAGNOSIS — M19072 Primary osteoarthritis, left ankle and foot: Secondary | ICD-10-CM | POA: Diagnosis not present

## 2019-11-11 DIAGNOSIS — M19071 Primary osteoarthritis, right ankle and foot: Secondary | ICD-10-CM | POA: Diagnosis not present

## 2019-11-11 DIAGNOSIS — M65872 Other synovitis and tenosynovitis, left ankle and foot: Secondary | ICD-10-CM | POA: Diagnosis not present

## 2019-11-24 ENCOUNTER — Ambulatory Visit: Payer: BC Managed Care – PPO | Admitting: Podiatry

## 2019-11-26 ENCOUNTER — Ambulatory Visit: Payer: Medicaid Other | Attending: Internal Medicine

## 2019-11-26 DIAGNOSIS — Z23 Encounter for immunization: Secondary | ICD-10-CM

## 2019-11-26 NOTE — Progress Notes (Signed)
   Covid-19 Vaccination Clinic  Name:  Penny Horne    MRN: [id number] DOB: February 27, 1981  11/26/2019  [name] was observed post Covid-19 immunization for 15 minutes without incident. She was provided with Vaccine Information Sheet and instruction to access the V-Safe system.   [name] was instructed to call 911 with any severe reactions post vaccine: Marland Kitchen Difficulty breathing  . Swelling of face and throat  . A fast heartbeat  . A bad rash all over body  . Dizziness and weakness   Immunizations Administered    Name Date Dose VIS Date Route   Pfizer COVID-19 Vaccine 11/26/2019  3:10 PM 0.3 mL 08/13/2019 Intramuscular   Manufacturer: [employer], [employer]   Lot: [id number]   NDC: [address]-7

## 2019-11-27 ENCOUNTER — Ambulatory Visit: Payer: Commercial Managed Care - PPO

## 2019-12-20 ENCOUNTER — Ambulatory Visit: Payer: BC Managed Care – PPO | Admitting: Podiatry

## 2019-12-21 ENCOUNTER — Ambulatory Visit: Payer: Medicaid Other | Attending: Internal Medicine

## 2019-12-21 DIAGNOSIS — Z23 Encounter for immunization: Secondary | ICD-10-CM

## 2019-12-21 NOTE — Progress Notes (Signed)
   Covid-19 Vaccination Clinic  Name:  Penny Horne    MRN: [id number] DOB: 20-Sep-1980  12/21/2019  [name] was observed post Covid-19 immunization for 15 minutes without incident. She was provided with Vaccine Information Sheet and instruction to access the V-Safe system.   [name] was instructed to call 911 with any severe reactions post vaccine: Marland Kitchen Difficulty breathing  . Swelling of face and throat  . A fast heartbeat  . A bad rash all over body  . Dizziness and weakness   Immunizations Administered    Name Date Dose VIS Date Route   Pfizer COVID-19 Vaccine 12/21/2019  3:02 PM 0.3 mL 10/27/2018 Intramuscular   Manufacturer: [employer], [employer]   Lot: [id number]   NDC: [address]-5

## 2020-09-11 ENCOUNTER — Ambulatory Visit (INDEPENDENT_AMBULATORY_CARE_PROVIDER_SITE_OTHER): Payer: Managed Care, Other (non HMO)

## 2020-09-11 ENCOUNTER — Ambulatory Visit: Payer: BC Managed Care – PPO | Admitting: Podiatry

## 2020-09-11 ENCOUNTER — Other Ambulatory Visit: Payer: Self-pay

## 2020-09-11 ENCOUNTER — Other Ambulatory Visit: Payer: Managed Care, Other (non HMO)

## 2020-09-11 DIAGNOSIS — Z20822 Contact with and (suspected) exposure to covid-19: Secondary | ICD-10-CM

## 2020-09-11 DIAGNOSIS — M2012 Hallux valgus (acquired), left foot: Secondary | ICD-10-CM | POA: Diagnosis not present

## 2020-09-11 DIAGNOSIS — M2041 Other hammer toe(s) (acquired), right foot: Secondary | ICD-10-CM

## 2020-09-11 DIAGNOSIS — M2011 Hallux valgus (acquired), right foot: Secondary | ICD-10-CM | POA: Diagnosis not present

## 2020-09-11 NOTE — Progress Notes (Signed)
   Subjective: 40 y.o. female presenting today for evaluation of pain and tenderness to the right great toe joint. Patient has a longstanding history of a bunion deformity to the right foot. She has been seeing Dr. Elijah Birk, local podiatrist for conservative management. She has had injections and tried different shoe gear modifications with minimal relief. She presents for surgical consultation and more definitive treatment options  Past Medical History:  Diagnosis Date  . 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 History of ectopic pregnancy    LEFT RUPTURED ECTOPIC PREG. S/P  SALPINGECTOMY 09-20-2015  . Hydrosalpinx    RIGHT  . Pregnancy resulting from in vitro fertilization, third trimester 03/17/2018  . Shoulder dystocia during labor and delivery, delivered 03/17/2018  . Wears glasses      Objective: Physical Exam General: The patient is alert and oriented x3 in no acute distress.  Dermatology: Skin is cool, dry and supple bilateral lower extremities. Negative for open lesions or macerations.  Vascular: Palpable pedal pulses bilaterally. No edema or erythema noted. Capillary refill within normal limits.  Neurological: Epicritic and protective threshold grossly intact bilaterally.   Musculoskeletal Exam: Clinical evidence of bunion deformity noted to the respective foot. There is moderate pain on palpation range of motion of the first MPJ. Lateral deviation of the hallux noted consistent with hallux abductovalgus. Hammertoe contracture also noted on clinical exam to digits #5 of the right foot. Symptomatic pain on palpation and range of motion also noted to the metatarsal phalangeal joints of the respective hammertoe digits.    Radiographic Exam: Increased intermetatarsal angle greater than 15 with a hallux abductus angle greater than 30 noted on AP view. Moderate degenerative changes noted within the first MPJ. Contracture deformity also noted to the interphalangeal joints and MPJs of the  digits of the respective hammertoes.    Assessment: 1. HAV w/ bunion deformity right 2. Hammertoe deformity fifth digit right   Plan of Care:  1. Patient was evaluated. X-Rays reviewed. 2. Today we discussed the conservative versus surgical management of the presenting pathology. The patient opts for surgical management. All possible complications and details of the procedure were explained. All patient questions were answered. No guarantees were expressed or implied. 3. Authorization for surgery was initiated today. Surgery will consist of bunionectomy with osteotomy right. PIPJ arthroplasty with derotational skin plasty fifth digit right. 4. 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 Has a 73-year-old daughter.     Felecia Shelling, DPM [facility]  Dr. Felecia Shelling, DPM    [address] [place], [place] [address]                Office [phone]  Fax [phone]

## 2020-09-12 LAB — NOVEL CORONAVIRUS, NAA: SARS-CoV-2, NAA: NOT DETECTED

## 2020-09-12 LAB — SARS-COV-2, NAA 2 DAY TAT

## 2020-10-26 ENCOUNTER — Telehealth: Payer: Self-pay

## 2020-10-26 NOTE — Telephone Encounter (Signed)
DOS 11/09/2020  [place] BUNIONECTOMY RT - 50093 HAMMERTOE REPAIR 5TH RT - 28285  [employer]   PER [employer] AUTOMATED SYSTEM NO PRECERT REQUIRED FOR THE FOLLOWING CPT  [address] CONF # [id number] [phone] CONF [phone]

## 2020-11-07 ENCOUNTER — Telehealth: Payer: Self-pay

## 2020-11-07 NOTE — Telephone Encounter (Signed)
Kalie called to cancel her surgery with Dr. Logan Bores on 11/09/20. She stated she is pregnant and he OB doesn't want her having surgery. Notified Dr. Logan Bores and Aram Beecham with [facility]

## 2020-11-15 ENCOUNTER — Encounter: Payer: Managed Care, Other (non HMO) | Admitting: Podiatry

## 2020-11-22 ENCOUNTER — Encounter: Payer: Managed Care, Other (non HMO) | Admitting: Podiatry

## 2020-12-04 ENCOUNTER — Encounter: Payer: Managed Care, Other (non HMO) | Admitting: Podiatry

## 2020-12-31 DIAGNOSIS — Z419 Encounter for procedure for purposes other than remedying health state, unspecified: Secondary | ICD-10-CM | POA: Diagnosis not present

## 2021-01-31 DIAGNOSIS — Z419 Encounter for procedure for purposes other than remedying health state, unspecified: Secondary | ICD-10-CM | POA: Diagnosis not present

## 2021-02-07 DIAGNOSIS — Z8759 Personal history of other complications of pregnancy, childbirth and the puerperium: Secondary | ICD-10-CM | POA: Insufficient documentation

## 2021-02-07 DIAGNOSIS — O30049 Twin pregnancy, dichorionic/diamniotic, unspecified trimester: Secondary | ICD-10-CM | POA: Insufficient documentation

## 2021-02-09 DIAGNOSIS — O99210 Obesity complicating pregnancy, unspecified trimester: Secondary | ICD-10-CM | POA: Insufficient documentation

## 2021-02-09 DIAGNOSIS — O09529 Supervision of elderly multigravida, unspecified trimester: Secondary | ICD-10-CM | POA: Insufficient documentation

## 2021-02-13 LAB — HM PAP SMEAR: HPV, high-risk: NEGATIVE

## 2021-02-19 LAB — OB RESULTS CONSOLE HIV ANTIBODY (ROUTINE TESTING): HIV: NONREACTIVE

## 2021-02-19 LAB — OB RESULTS CONSOLE RPR: RPR: NONREACTIVE

## 2021-02-19 LAB — OB RESULTS CONSOLE HEPATITIS B SURFACE ANTIGEN: Hepatitis B Surface Ag: NEGATIVE

## 2021-02-19 LAB — OB RESULTS CONSOLE RUBELLA ANTIBODY, IGM: Rubella: IMMUNE

## 2021-03-02 DIAGNOSIS — Z419 Encounter for procedure for purposes other than remedying health state, unspecified: Secondary | ICD-10-CM | POA: Diagnosis not present

## 2021-04-02 DIAGNOSIS — Z419 Encounter for procedure for purposes other than remedying health state, unspecified: Secondary | ICD-10-CM | POA: Diagnosis not present

## 2021-05-03 DIAGNOSIS — Z419 Encounter for procedure for purposes other than remedying health state, unspecified: Secondary | ICD-10-CM | POA: Diagnosis not present

## 2021-05-20 ENCOUNTER — Inpatient Hospital Stay (HOSPITAL_COMMUNITY)
Admission: AD | Admit: 2021-05-20 | Discharge: 2021-06-03 | DRG: 788 | Disposition: A | Discharge status: Home / Self Care | Payer: Managed Care, Other (non HMO) | Attending: Obstetrics & Gynecology | Admitting: Obstetrics & Gynecology

## 2021-05-20 ENCOUNTER — Other Ambulatory Visit: Payer: Self-pay

## 2021-05-20 ENCOUNTER — Encounter (HOSPITAL_COMMUNITY): Payer: Self-pay | Admitting: Obstetrics & Gynecology

## 2021-05-20 DIAGNOSIS — O322XX2 Maternal care for transverse and oblique lie, fetus 2: Secondary | ICD-10-CM | POA: Diagnosis present

## 2021-05-20 DIAGNOSIS — O43212 Placenta accreta, second trimester: Secondary | ICD-10-CM | POA: Diagnosis present

## 2021-05-20 DIAGNOSIS — O4290 Premature rupture of membranes, unspecified as to length of time between rupture and onset of labor, unspecified weeks of gestation: Secondary | ICD-10-CM

## 2021-05-20 DIAGNOSIS — O43122 Velamentous insertion of umbilical cord, second trimester: Secondary | ICD-10-CM | POA: Diagnosis present

## 2021-05-20 DIAGNOSIS — Z87891 Personal history of nicotine dependence: Secondary | ICD-10-CM

## 2021-05-20 DIAGNOSIS — O30042 Twin pregnancy, dichorionic/diamniotic, second trimester: Secondary | ICD-10-CM | POA: Diagnosis present

## 2021-05-20 DIAGNOSIS — D509 Iron deficiency anemia, unspecified: Secondary | ICD-10-CM | POA: Diagnosis present

## 2021-05-20 DIAGNOSIS — Z20822 Contact with and (suspected) exposure to covid-19: Secondary | ICD-10-CM | POA: Diagnosis present

## 2021-05-20 DIAGNOSIS — Z3A24 24 weeks gestation of pregnancy: Secondary | ICD-10-CM | POA: Diagnosis not present

## 2021-05-20 DIAGNOSIS — O42912 Preterm premature rupture of membranes, unspecified as to length of time between rupture and onset of labor, second trimester: Principal | ICD-10-CM | POA: Diagnosis present

## 2021-05-20 DIAGNOSIS — D500 Iron deficiency anemia secondary to blood loss (chronic): Secondary | ICD-10-CM | POA: Diagnosis present

## 2021-05-20 DIAGNOSIS — D649 Anemia, unspecified: Secondary | ICD-10-CM | POA: Diagnosis present

## 2021-05-20 DIAGNOSIS — O99214 Obesity complicating childbirth: Secondary | ICD-10-CM | POA: Diagnosis present

## 2021-05-20 DIAGNOSIS — O9902 Anemia complicating childbirth: Secondary | ICD-10-CM | POA: Diagnosis present

## 2021-05-20 DIAGNOSIS — O42919 Preterm premature rupture of membranes, unspecified as to length of time between rupture and onset of labor, unspecified trimester: Secondary | ICD-10-CM | POA: Diagnosis present

## 2021-05-20 DIAGNOSIS — O9962 Diseases of the digestive system complicating childbirth: Secondary | ICD-10-CM | POA: Diagnosis present

## 2021-05-20 DIAGNOSIS — Z23 Encounter for immunization: Secondary | ICD-10-CM

## 2021-05-20 DIAGNOSIS — K219 Gastro-esophageal reflux disease without esophagitis: Secondary | ICD-10-CM | POA: Diagnosis present

## 2021-05-20 DIAGNOSIS — R030 Elevated blood-pressure reading, without diagnosis of hypertension: Secondary | ICD-10-CM | POA: Diagnosis present

## 2021-05-20 DIAGNOSIS — O26892 Other specified pregnancy related conditions, second trimester: Secondary | ICD-10-CM | POA: Diagnosis present

## 2021-05-20 LAB — POCT FERN TEST: POCT Fern Test: POSITIVE

## 2021-05-20 NOTE — MAU Provider Note (Signed)
History   [phone]   Chief Complaint  Patient presents with   Rupture of Membranes    HPI Penny Horne is a 40 y.o. female  [id number] @24 .5 wks here with report of gush of clear fluid around 2230. Leaking of fluid has continued. Pt reports irregular mild contractions. She denies vaginal bleeding. She reports good fetal movement x2. All other systems negative.    No LMP recorded. Patient is pregnant.  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 S/P  SALPINGECTOMY 09-20-2015   Hydrosalpinx    RIGHT   Pregnancy resulting from in vitro fertilization, third trimester 03/17/2018   Shoulder dystocia during labor and delivery, delivered 03/17/2018   Wears glasses     No family history on file.  Social History   Socioeconomic History   Marital status: Married    Spouse name: Not on file   Number of children: Not on file   Years of education: Not on file   Highest education level: Not on file  Occupational History   Not on file  Tobacco Use   Smoking status: Former    Years: 20.00    Types: Cigarettes    Quit date: 09/05/2015    Years since quitting: 5.7   Smokeless tobacco: Never   Tobacco comments:    occasionally  Substance and Sexual Activity   Alcohol use: Yes    Comment: occasionally   Drug use: No   Sexual activity: Yes  Other Topics Concern   Not on file  Social History Narrative   Not on file   Social Determinants of Health   Financial Resource Strain: Not on file  Food Insecurity: Not on file  Transportation Needs: Not on file  Physical Activity: Not on  file  Stress: Not on file  Social Connections: Not on file    No Known Allergies  No current facility-administered medications on file prior to encounter.   Current Outpatient Medications on File Prior to Encounter  Medication Sig Dispense Refill   Ferrous Gluconate (IRON 27 PO) Take 27 mg by mouth daily.     Prenatal Vit-Fe Fumarate-FA (PRENATAL MULTIVITAMIN) TABS tablet Take 1 tablet by mouth daily at 12 noon.     acetaminophen (TYLENOL) 325 MG tablet Take 2 tablets (650 mg total) by mouth every 4 (four) hours as needed (for pain scale < 4). 60 tablet 1   ibuprofen (ADVIL,MOTRIN) 600 MG tablet Take 1 tablet (600 mg total) by mouth every 6 (six) hours. 30 tablet 0     Review of Systems  Gastrointestinal:  Negative for abdominal pain.  Genitourinary:  Positive for vaginal discharge. Negative for vaginal bleeding.    Physical Exam   Vitals:   05/20/21 2329 05/20/21 2332  BP: (!) 143/86   Pulse: 96   Resp: 18  Temp:  98.4 F (36.9 C)  SpO2: 98%   Weight: 127.5 kg   Height: 5\' 6"  (1.676 m)     Physical Exam Vitals and nursing note reviewed.  Constitutional:      General: She is not in acute distress.    Appearance: Normal appearance.  HENT:     Head: Normocephalic and atraumatic.  Cardiovascular:     Rate and Rhythm: Normal rate.  Pulmonary:     Effort: Pulmonary effort is normal. No respiratory distress.  Abdominal:     Palpations: Abdomen is soft.     Tenderness: There is no abdominal tenderness.     Comments: gravid  Musculoskeletal:        General: Normal range of motion.     Cervical back: Normal range of motion.  Skin:    General: Skin is warm and dry.  Neurological:     General: No focal deficit present.     Mental Status: She is alert and oriented to person, place, and time.  Psychiatric:        Mood and Affect: Mood normal.        Behavior: Behavior normal.   EFM-A: 145 bpm, mod variability, + accels, no decels EFM-B: 135 bpm, mod variability, +  accels, no decels Toco: none  Results for orders placed or performed during the hospital encounter of 05/20/21 (from the past 24 hour(s))  Fern Test     Status: Abnormal   Collection Time: 05/20/21 11:49 PM  Result Value Ref Range   POCT Fern Test Positive = ruptured amniotic membanes    MAU Course  Procedures  MDM Prenatal records not on file. Pt reports no complications other than AMA and IVF pregnancy. Gross ROM, no signs of labor at this time, pelvic exam deferred. Dr. 05/22/21 notified of presentation and clinical findings, plan for admit.   Assessment and Plan  [redacted] weeks gestation Didi twins Admit to [facility] unit Mngt per Dr. [facility], [place], CNM 05/20/2021 11:57 PM

## 2021-05-20 NOTE — MAU Note (Signed)
.  Penny Horne is a 40 y.o. at [redacted]w[redacted]d here in MAU reporting: water broke around 2330 today with contractions every 5 mins. Fluid was clear. +FM, no abnormal discharge.   Onset of complaint: 05/20/2021 Pain score: 1/10 Vitals:   05/20/21 2329 05/20/21 2332  BP: (!) 143/86   Pulse: 96   Resp: 18   Temp:  98.4 F (36.9 C)  SpO2: 98%       Lab orders placed from triage:

## 2021-05-21 ENCOUNTER — Inpatient Hospital Stay (HOSPITAL_BASED_OUTPATIENT_CLINIC_OR_DEPARTMENT_OTHER): Payer: Managed Care, Other (non HMO)

## 2021-05-21 ENCOUNTER — Inpatient Hospital Stay (HOSPITAL_COMMUNITY): Payer: Managed Care, Other (non HMO)

## 2021-05-21 DIAGNOSIS — Z3A24 24 weeks gestation of pregnancy: Secondary | ICD-10-CM | POA: Diagnosis not present

## 2021-05-21 DIAGNOSIS — O09522 Supervision of elderly multigravida, second trimester: Secondary | ICD-10-CM

## 2021-05-21 DIAGNOSIS — O43212 Placenta accreta, second trimester: Secondary | ICD-10-CM | POA: Diagnosis present

## 2021-05-21 DIAGNOSIS — Z87891 Personal history of nicotine dependence: Secondary | ICD-10-CM | POA: Diagnosis not present

## 2021-05-21 DIAGNOSIS — O42912 Preterm premature rupture of membranes, unspecified as to length of time between rupture and onset of labor, second trimester: Principal | ICD-10-CM

## 2021-05-21 DIAGNOSIS — O42919 Preterm premature rupture of membranes, unspecified as to length of time between rupture and onset of labor, unspecified trimester: Secondary | ICD-10-CM | POA: Diagnosis present

## 2021-05-21 DIAGNOSIS — O4292 Full-term premature rupture of membranes, unspecified as to length of time between rupture and onset of labor: Secondary | ICD-10-CM | POA: Diagnosis not present

## 2021-05-21 DIAGNOSIS — O43122 Velamentous insertion of umbilical cord, second trimester: Secondary | ICD-10-CM | POA: Diagnosis present

## 2021-05-21 DIAGNOSIS — O99214 Obesity complicating childbirth: Secondary | ICD-10-CM | POA: Diagnosis present

## 2021-05-21 DIAGNOSIS — O09812 Supervision of pregnancy resulting from assisted reproductive technology, second trimester: Secondary | ICD-10-CM | POA: Diagnosis not present

## 2021-05-21 DIAGNOSIS — O9902 Anemia complicating childbirth: Secondary | ICD-10-CM | POA: Diagnosis present

## 2021-05-21 DIAGNOSIS — O30042 Twin pregnancy, dichorionic/diamniotic, second trimester: Secondary | ICD-10-CM | POA: Diagnosis present

## 2021-05-21 DIAGNOSIS — D509 Iron deficiency anemia, unspecified: Secondary | ICD-10-CM | POA: Diagnosis present

## 2021-05-21 DIAGNOSIS — Z3A25 25 weeks gestation of pregnancy: Secondary | ICD-10-CM | POA: Diagnosis not present

## 2021-05-21 DIAGNOSIS — R030 Elevated blood-pressure reading, without diagnosis of hypertension: Secondary | ICD-10-CM | POA: Diagnosis present

## 2021-05-21 DIAGNOSIS — O9962 Diseases of the digestive system complicating childbirth: Secondary | ICD-10-CM | POA: Diagnosis present

## 2021-05-21 DIAGNOSIS — Z23 Encounter for immunization: Secondary | ICD-10-CM | POA: Diagnosis not present

## 2021-05-21 DIAGNOSIS — Z419 Encounter for procedure for purposes other than remedying health state, unspecified: Secondary | ICD-10-CM | POA: Diagnosis not present

## 2021-05-21 DIAGNOSIS — K219 Gastro-esophageal reflux disease without esophagitis: Secondary | ICD-10-CM | POA: Diagnosis present

## 2021-05-21 DIAGNOSIS — O322XX2 Maternal care for transverse and oblique lie, fetus 2: Secondary | ICD-10-CM | POA: Diagnosis present

## 2021-05-21 DIAGNOSIS — O26892 Other specified pregnancy related conditions, second trimester: Secondary | ICD-10-CM | POA: Diagnosis present

## 2021-05-21 DIAGNOSIS — Z20822 Contact with and (suspected) exposure to covid-19: Secondary | ICD-10-CM | POA: Diagnosis present

## 2021-05-21 LAB — CBC
HCT: 35.7 % — ABNORMAL LOW (ref 36.0–46.0)
Hemoglobin: 12.1 g/dL (ref 12.0–15.0)
MCH: 30.8 pg (ref 26.0–34.0)
MCHC: 33.9 g/dL (ref 30.0–36.0)
MCV: 90.8 fL (ref 80.0–100.0)
Platelets: 228 10*3/uL (ref 150–400)
RBC: 3.93 MIL/uL (ref 3.87–5.11)
RDW: 14.4 % (ref 11.5–15.5)
WBC: 8.9 10*3/uL (ref 4.0–10.5)
nRBC: 0 % (ref 0.0–0.2)

## 2021-05-21 LAB — COMPREHENSIVE METABOLIC PANEL
ALT: 25 U/L (ref 0–44)
AST: 36 U/L (ref 15–41)
Albumin: 2.9 g/dL — ABNORMAL LOW (ref 3.5–5.0)
Alkaline Phosphatase: 65 U/L (ref 38–126)
Anion gap: 11 (ref 5–15)
BUN: 6 mg/dL (ref 6–20)
CO2: 19 mmol/L — ABNORMAL LOW (ref 22–32)
Calcium: 9.8 mg/dL (ref 8.9–10.3)
Chloride: 103 mmol/L (ref 98–111)
Creatinine, Ser: 0.6 mg/dL (ref 0.44–1.00)
GFR, Estimated: >60 mL/min (ref >60)
Glucose, Bld: 95 mg/dL (ref 70–99)
Potassium: 4.2 mmol/L (ref 3.5–5.1)
Sodium: 133 mmol/L — ABNORMAL LOW (ref 135–145)
Total Bilirubin: 0.8 mg/dL (ref 0.3–1.2)
Total Protein: 6.6 g/dL (ref 6.5–8.1)

## 2021-05-21 LAB — TYPE AND SCREEN
ABO/RH(D): A POS
Antibody Screen: NEGATIVE

## 2021-05-21 LAB — RESP PANEL BY RT-PCR (FLU A&B, COVID) ARPGX2
Influenza A by PCR: NEGATIVE
Influenza B by PCR: NEGATIVE
SARS Coronavirus 2 by RT PCR: NEGATIVE

## 2021-05-21 LAB — RPR: RPR Ser Ql: NONREACTIVE

## 2021-05-21 MED ORDER — DOCUSATE SODIUM 100 MG PO CAPS
100.0000 mg | ORAL_CAPSULE | Freq: Every day | ORAL | Status: DC
  Administered 2021-05-21 – 2021-05-30 (×10): 100 mg via ORAL
  Filled 2021-05-21 (×10): qty 1

## 2021-05-21 MED ORDER — BETAMETHASONE SOD PHOS & ACET 6 (3-3) MG/ML IJ SUSP
12.0000 mg | INTRAMUSCULAR | Status: AC
  Administered 2021-05-21 – 2021-05-22 (×2): 12 mg via INTRAMUSCULAR
  Filled 2021-05-21 (×2): qty 5

## 2021-05-21 MED ORDER — ZOLPIDEM TARTRATE 5 MG PO TABS: 5.0000 mg | ORAL_TABLET | Freq: Every evening | ORAL | Status: DC | PRN

## 2021-05-21 MED ORDER — AMPICILLIN SODIUM 2 G IJ SOLR
2.0000 g | Freq: Four times a day (QID) | INTRAMUSCULAR | Status: AC
  Administered 2021-05-21 – 2021-05-22 (×8): 2 g via INTRAVENOUS
  Filled 2021-05-21 (×8): qty 2000

## 2021-05-21 MED ORDER — ACETAMINOPHEN 325 MG PO TABS: 650.0000 mg | ORAL_TABLET | ORAL | Status: DC | PRN

## 2021-05-21 MED ORDER — PRENATAL MULTIVITAMIN CH
1.0000 | ORAL_TABLET | Freq: Every day | ORAL | Status: DC
  Filled 2021-05-21 (×5): qty 1

## 2021-05-21 MED ORDER — AMOXICILLIN 500 MG PO CAPS
500.0000 mg | ORAL_CAPSULE | Freq: Three times a day (TID) | ORAL | Status: AC
  Administered 2021-05-23 – 2021-05-27 (×15): 500 mg via ORAL
  Filled 2021-05-21 (×15): qty 1

## 2021-05-21 MED ORDER — AZITHROMYCIN 250 MG PO TABS
1000.0000 mg | ORAL_TABLET | Freq: Once | ORAL | Status: AC
  Administered 2021-05-21: 1000 mg via ORAL
  Filled 2021-05-21: qty 4

## 2021-05-21 MED ORDER — CALCIUM CARBONATE ANTACID 500 MG PO CHEW
2.0000 | CHEWABLE_TABLET | ORAL | Status: DC | PRN
  Administered 2021-05-28: 400 mg via ORAL
  Filled 2021-05-21: qty 2

## 2021-05-21 MED ORDER — DEXTROSE IN LACTATED RINGERS 5 % IV SOLN: INTRAVENOUS | Status: DC

## 2021-05-21 NOTE — Plan of Care (Signed)
  Problem: Education: Goal: Knowledge of disease or condition will improve 05/21/2021 0334 by Bobbye Morton, RN Outcome: Progressing 05/21/2021 0334 by Bobbye Morton, RN Outcome: Progressing 05/21/2021 0322 by Bobbye Morton, RN Outcome: Progressing Goal: Knowledge of the prescribed therapeutic regimen will improve 05/21/2021 0334 by Bobbye Morton, RN Outcome: Progressing 05/21/2021 0334 by Bobbye Morton, RN Outcome: Progressing 05/21/2021 0322 by Bobbye Morton, RN Outcome: Progressing Goal: Individualized Educational Video(s) 05/21/2021 0334 by Bobbye Morton, RN Outcome: Progressing 05/21/2021 0334 by Bobbye Morton, RN Outcome: Progressing 05/21/2021 0322 by Bobbye Morton, RN Outcome: Progressing

## 2021-05-21 NOTE — H&P (Signed)
Penny Horne is a 40 y.o. female [phone] [redacted]w[redacted]d with DCDA twin pregnancy presenting for SROM. Patient reports large gush of clear fluid around 10:30 PM last night. Initially thought it was urine but then continued to leak and noticed some mucus to it so her and husband called 911 and was BIBA. She was confirmed grossly ruptured in MAU. She denies any contractions or VB. Endorses good FM from babies. No recent illnesses, fevers, chills, or N/V/D.   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 Pregnancy dated by ET date showing DCDA twin pregnancy. Routine prenatal care with [facility], Dr. Juliene Horne primary OB provider, since 10 weeks. Prenatal labs significant for Varicella non-immune, otherwise WNL. Patient did not have PGT on embryos but did have Panorama NIPT that showed low risk female fetus x 2 and AFP screen negative. Anatomy US performed in office showed normal female anatomy x2 with marginal cord insertion for baby B, and transverse lie x2 at that time.   OB history significant for 2 previous SVD. Second pregnancy complicated by 50 sec shoulder dystocia with delivery at full term and baby weight 8 pounds 8 ounces. History of ectopic pregnancy resulting on salpingectomy and subsequent contralateral salpingectomy for tubal infertility.   No significant PMH. Patient denies any history of HTN in previous pregnancies or outside of pregnancies. Obesity BMI 45, was planning for early 1hr GTT, had normal A1C with initial OB labs (5.5).   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 S/P  SALPINGECTOMY 09-20-2015   Hydrosalpinx    RIGHT   Pregnancy resulting from in vitro  fertilization, third trimester 03/17/2018   Shoulder dystocia during labor and delivery, delivered 03/17/2018   Wears glasses    Past Surgical History:  Procedure Laterality Date   LAPAROSCOPY N/A 09/20/2015   Procedure: LAPAROSCOPY DIAGNOSTIC;  Surgeon: Penny Evans, MD;  Location: [facility];  Service: Gynecology;  Laterality: N/A;   LAPAROSCOPY N/A 03/25/2017   Procedure: LAPAROSCOPY OPERATIVE;  Surgeon: Penny Schwab, MD;  Location: [facility];  Service: Gynecology;  Laterality: N/A;   LYSIS OF ADHESION N/A 03/25/2017   Procedure: LAPAROSCOPY, LYSIS OF ADHESION, BILATERAL SALPINGECTOMY, CHROMOPERTUBATION, ENDOMETRIAL BIOPSY;  Surgeon: Penny Schwab, MD;  Location: [facility];  Service: Gynecology;  Laterality: N/A;   UNILATERAL SALPINGECTOMY Left 09/20/2015   Procedure: UNILATERAL SALPINGECTOMY;  Surgeon: Penny Evans, MD;  Location: [facility];  Service: Gynecology;  Laterality: Left;   Family History: family history is not on file. Social History:  reports that she quit smoking about 5 years ago. Her smoking use included cigarettes. She has never used smokeless tobacco. She reports current alcohol use. She reports that she does not use drugs.     Maternal Diabetes: No Genetic Screening: Normal Maternal Ultrasounds/Referrals: Normal and Other: [facility] Baby B  Fetal Ultrasounds or other Referrals:  None Maternal Substance Abuse:  No Significant Maternal Medications:  None Significant  Maternal Lab Results:  None Other Comments:  None  Review of Systems  All other systems reviewed and are negative. Per HPI Maternal Exam:  Uterine Assessment: Contraction frequency is rare.  Abdomen: Patient reports no abdominal tenderness. Fundal height is S>D.   Fetal presentation: vertex Introitus: not evaluated.   Cervix: not evaluated.   Fetal Exam Fetal State Assessment: Category I - tracings are normal.  -Baby A: baseline 140 bpm mod var +accels, -decels -Baby  B: baseline 140 bpm mod var +accels, -decels  Toco: acontractile  [place] Report PENDING -Baby A appears cephalic with MVP 2 cm -Baby B appears transverse with MVP 6 cm  Physical Exam Vitals reviewed.  Constitutional:      Appearance: Normal appearance.  HENT:     Head: Normocephalic.  Cardiovascular:     Rate and Rhythm: Normal rate.  Pulmonary:     Effort: Pulmonary effort is normal.  Abdominal:     Palpations: Abdomen is soft.     Tenderness: There is no abdominal tenderness.  Musculoskeletal:        General: Normal range of motion.     Cervical back: Normal range of motion.  Skin:    General: Skin is warm and dry.  Neurological:     General: No focal deficit present.     Mental Status: She is alert and oriented to person, place, and time.  Psychiatric:        Mood and Affect: Mood normal.        Behavior: Behavior normal.      Blood pressure 120/69, pulse 99, temperature 97.7 F (36.5 C), temperature source Oral, resp. rate 17, height 5\' 6"  (1.676 m), weight 127.5 kg, SpO2 99 %, unknown if currently breastfeeding.  Prenatal labs: ABO, Rh:  --/--/A POS (09/19 0035) Antibody: NEG (09/19 0035) Rubella:  Immune RPR:   NR  HBsAg:   NEG (02/19/21) HIV:   NR GBS:   Pending  Chemistry Recent Labs  Lab 05/21/21 0007  NA 133*  K 4.2  CL 103  CO2 19*  GLUCOSE 95  BUN 6  CREATININE 0.60  CALCIUM 9.8  GFRNONAA >60  ANIONGAP 11    Recent Labs  Lab 05/21/21 0007  PROT 6.6  ALBUMIN 2.9*  AST 36  ALT 25  ALKPHOS 65  BILITOT 0.8   Hematology Recent Labs  Lab 05/21/21 0007  WBC 8.9  RBC 3.93  HGB 12.1  HCT 35.7*  MCV 90.8  MCH 30.8  MCHC 33.9  RDW 14.4  PLT 228     Assessment/Plan: Penny Horne is a 40 y.o. female 41 at [redacted]w[redacted]d admitted with DCDA twin pregnancy complicated by [facility] 9/18  Patient counseled on clinical significance of PPROM and recommended inpatient management on [facility] specialty care unit. We discussed increased risks for PTL,  PTD, and infection. We reviewed plan of care as follows:  PPROM -Latency antibiotics: s/p Azithromycin, currently on 48hrs of IV Ampicillin and plan to transition to PO Amoxicillin for total of 7 days of antibiotic therapy -S/p 1st dose of BMZ steroid injection for fetal lung maturity 9/19 @ 0210, plan for 2nd dose tonight 9/20 @ 0210 -Growth 10/20 requested, follow up report. Consider weekly fluid check -NICU consult requested -Afebrile, asymptomatic, normal WBC on admission, continue to monitor for signs/symptoms of infection DCDA Twin Gestation -[facility] baby B, follow growth q3-4 weeks -Patient desires vaginal delivery. 9/19 cephalic A, transverse B. We briefly discussed route of delivery pending clinical situation, provider on call,  and presentation of baby B Elevated BP without diagnosis of HTN -PIH labs on admission WNL, normal BP this morning, cont to monitor Antepartum care -Daily PNV with Fe and Folic acid -Regular diet -Will need GTT next week after BMZ completed  -Will need Tdap  -SCD VTE ppx for now, consider pharmacologic therapy given BMI -Routine prenatal care  Admission until delivery with delivery goal of 34-36 weeks discussed  Eleni Frank A Ottavio Norem 05/21/2021, 8:58 AM

## 2021-05-21 NOTE — Plan of Care (Signed)
  Problem: Education: Goal: Knowledge of disease or condition will improve 05/21/2021 0334 by Bobbye Morton, RN Outcome: Progressing 05/21/2021 0322 by Bobbye Morton, RN Outcome: Progressing Goal: Knowledge of the prescribed therapeutic regimen will improve 05/21/2021 0334 by Bobbye Morton, RN Outcome: Progressing 05/21/2021 0322 by Bobbye Morton, RN Outcome: Progressing Goal: Individualized Educational Video(s) 05/21/2021 0334 by Bobbye Morton, RN Outcome: Progressing 05/21/2021 0322 by Bobbye Morton, RN Outcome: Progressing   Problem: Education: Goal: Individualized Educational Video(s) 05/21/2021 0334 by Bobbye Morton, RN Outcome: Progressing 05/21/2021 0322 by Bobbye Morton, RN Outcome: Progressing   Problem: Clinical Measurements: Goal: Complications related to the disease process, condition or treatment will be avoided or minimized 05/21/2021 0334 by Bobbye Morton, RN Outcome: Progressing 05/21/2021 0322 by Bobbye Morton, RN Outcome: Progressing

## 2021-05-21 NOTE — Plan of Care (Signed)
  Problem: Education: Goal: Knowledge of disease or condition will improve Outcome: Progressing Goal: Knowledge of the prescribed therapeutic regimen will improve Outcome: Progressing Goal: Individualized Educational Video(s) Outcome: Progressing   Problem: Clinical Measurements: Goal: Complications related to the disease process, condition or treatment will be avoided or minimized Outcome: Progressing   

## 2021-05-21 NOTE — Consult Note (Signed)
Maternal-Fetal Medicine   Name: Aaron Boeh DOB: 06/03/1981 MRN: [id number] Referring Provider: Clance Boll, DO   I had the pleasure of seeing Ms. Stefanski today at [employer]. She is G4 P2012 at 24w 6d gestation with twin pregnancy and was admitted yesterday for c/o leakage of amniotic fluid. She does not give history of vaginal bleeding. Since admission, she received the first dose of betamethasone and is on IV ampicillin (latency antibiotics).  Her prenatal course has been, otherwise, uneventful. On cell-free fetal DNA screening, she had low risk for fetal aneuploidies. Patient conceived by [facility] Transfer.  PMH: No history of hypertension or diabetes or any chronic hypertension. PSH: Laparoscopic salpingectomy Medications: Prenatal vitamins, betamethasone, ampicillin. Allergies: NKDA. Social: Denies tobacco or drug or alcohol use. She has been married 6 years and her husband is in good health. She works in the Research scientist (medical) at [employer].  Obstetric history -1999: Term vaginal delivery of a female infant weighing 8-8 at birth. Her daughter is in good health. -2019: Term vaginal delivery of a female infant weighing 8-2 at birth. She is in good health. IVF-ET of 2 embryos her pregnancy was complicated by "vanishing twin." Gyn history: No history of abnormal Pap smears or cervical surgeries. Ultrasound Patient had limited ultrasound study performed today morning. Dichorionic-diamniotic twin pregnancy. Twin A: Lower fetus, cephalic presentation, anterior placenta, female fetus. Amniotic fluid is slightly decreased. Good fetal activity is seen. Twin B: Upper fetus, transverse lie and head to maternal right, anterior placenta, female fetus. Amniotic fluid is normal and good fetal activity is seen. P/E: Patient is comfortably lying on the bed; not in distress. Vitals: BP 120/69 mm Hg, Pulse 99/min, T 97.7, RR 17. Abd: Soft gravid uterus; no tenderness. No flank pain. Minimal edema  feet.  Our concerns include: PPROM (twin A) -I explained the diagnosis of PPROM, its management and timing of delivery. -Management of PPROM is like that in singleton pregnancies. -Complications include preterm delivery, infection, placental abruption, cord prolapse. -We recommend expectant management and inpatient stay till delivery.  About 70% of women undergo spontaneous labor within a week.  However, the latency period is increased in early gestational ages. -If expectant management is successful, we recommend delivery at 34 weeks' gestation.  In some cases, continuation of pregnancy beyond 34 weeks is reasonable and the patient will be counseled closer to that gestational age. -I explained the benefit of antenatal corticosteroids and prophylactic antibiotics.  Twin pregnancy -I explained chorionicity and that dichorionic-diamniotic twin pregnancy is have lower complication rates as against monochorionic-diamniotic twin pregnancy. -Preterm delivery is the most common complication of twin pregnancies. -Gestational diabetes and gestational hypertension/preeclampsia or more common in twin pregnancies. I discussed the benefit of low-dose aspirin prophylaxis that delays or prevents preeclampsia. -Mode of delivery will be dependent on the presentations and vaginal delivery is possible if the presenting twin is vertex. -Twin pregnancy and prolonged bed rest increases the risk of venous thromboembolism. If patient is not ambulating, heparin prophylactic anticoagulation should be considered.  Recommendations -Completed IV course of antibiotics followed by amoxicillin 250 mg PO q 8 hourly for 5 days. -Continue daily NST. -Fetal growth assessment. -NICU consultation after growth assessment. -Weekly ultrasound to check amniotic fluid. - Close monitoring for signs and symptoms of maternal infection. -Magnesium sulfate neuroprotection if the patient goes into labor before 32 weeks'  gestation. -Heparin prophylactic anticoagulation if mobility is restricted.  Thank you for consultation.  If you have any questions or concerns, please contact me the [facility].  Consultation including face-to-face (more than 50%) counseling 50 minutes.

## 2021-05-21 NOTE — Plan of Care (Signed)
  Problem: Education: Goal: Knowledge of disease or condition will improve 05/21/2021 0334 by Kristalyn Bergstresser, RN Outcome: Progressing 05/21/2021 0334 by Catlyn Shipton, RN Outcome: Progressing 05/21/2021 0322 by Briceson Broadwater, RN Outcome: Progressing Goal: Knowledge of the prescribed therapeutic regimen will improve 05/21/2021 0334 by Jr Milliron, RN Outcome: Progressing 05/21/2021 0334 by Marisah Laker, RN Outcome: Progressing 05/21/2021 0322 by Yuriy Cui, RN Outcome: Progressing Goal: Individualized Educational Video(s) 05/21/2021 0334 by Daimen Shovlin, RN Outcome: Progressing 05/21/2021 0334 by Rebeka Kimble, RN Outcome: Progressing 05/21/2021 0322 by Frederik Standley, RN Outcome: Progressing   

## 2021-05-22 MED ORDER — ASPIRIN EC 81 MG PO TBEC
81.0000 mg | DELAYED_RELEASE_TABLET | Freq: Every day | ORAL | Status: DC
  Administered 2021-05-22 – 2021-05-30 (×9): 81 mg via ORAL
  Filled 2021-05-22 (×9): qty 1

## 2021-05-22 NOTE — Progress Notes (Signed)
HD 2 PPROM twin A DCDA IVF pregnancy  S: No complaints today Reports little LOF. Good FM No fever, chills or SOB. No vaginal bleeding Rare contractions noted.   O: BP (!) 117/54 (BP Location: Right Arm)   Pulse 89   Temp 98.1 F (36.7 C) (Oral)   Resp 18   Ht 5\' 6"  (1.676 m)   Wt 127.5 kg   SpO2 99%   BMI 45.35 kg/m   NCAT WDWN NAD Neck: supple with FROM Lungs: CTA CV: RRR ABD: Gravid , NT No CVAT EXT: SCDs intact, No cords VE: deferred Neuro: nonfocal Skin: intact  CBC    Component Value Date/Time   WBC 8.9 05/21/2021 0007   RBC 3.93 05/21/2021 0007   HGB 12.1 05/21/2021 0007   HCT 35.7 (L) 05/21/2021 0007   PLT 228 05/21/2021 0007   MCV 90.8 05/21/2021 0007   MCH 30.8 05/21/2021 0007   MCHC 33.9 05/21/2021 0007   RDW 14.4 05/21/2021 0007   FHTs A and B 140s, 150s, BTBV 5-25, no decels, 10x10 accels UI occ contractions  Sono  9/19 c/w AGA x 2, concordant growth (828gms), vertex A, Transverse B. Low AFI A, Nl AFI B  A: 25wk DCDA twin IUP with PPROM A. Marginal PCI B. BMZ done 9/19,/920. No evidence of PTL or chorio  P: Continue ABX prophylaxis Weekly AFI NST q shift PTL precautions Monitor s/s chorio Watch BP closely Add BBASA Recommend csection at this time , given presentation twin B would not recommend breech VD until EFW > 1500gms Grwth sono q 3w Possible need for Mag neuroprophylaxis discussd

## 2021-05-23 ENCOUNTER — Encounter (HOSPITAL_COMMUNITY): Payer: Self-pay | Admitting: Obstetrics and Gynecology

## 2021-05-23 NOTE — Progress Notes (Signed)
HD3 PPROM twin A Di/Di  twins, IVF  No vb/ctx, occasional light amount leaking; +FM x2  Temp:  [97.7 F (36.5 C)-98.3 F (36.8 C)] 98.2 F (36.8 C) (09/21 0758) Pulse Rate:  [71-98] 71 (09/21 0758) Resp:  [17-18] 18 (09/21 0758) BP: (118-139)/(50-79) 118/50 (09/21 0758) SpO2:  [97 %-100 %] 99 % (09/21 0758)  A&ox3 Rrr/ctab Abd: soft, nt, gravid LE: no edema, nt bilat  No results found for this or any previous visit (from the past 24 hour(s)). CBC Latest Ref Rng & Units 05/21/2021 03/18/2018 03/16/2018  WBC 4.0 - 10.5 K/uL 8.9 14.0(H) 7.8  Hemoglobin 12.0 - 15.0 g/dL 29.9 2.4(Q) 11.4(L)  Hematocrit 36.0 - 46.0 % 35.7(L) 27.7(L) 33.6(L)  Platelets 150 - 400 K/uL 228 194 194   FHT:  A: 150s, nml variability, +accels, no decels B: 140s, nml variability, +accels, no decels TOCO: no ctx  Sono  9/19 c/w AGA x 2, concordant growth (828gms), vertex A, Transverse B. Low AFI A, Nl AFI B  A/P: 40 y/o [id number] at 25.1 with PPROM, twin a Contin latency abx, now oral; no evidency ptl/chorio; s/p mfm consult S/p bmz 9/19,9/20 Fetal status reassuring, contin nst q shift, wkly afi; repeat growth u/s 3 wk Plan magnesium for neuroprotection if delivery <32wga; plan c/s for delivery currently (cephalic/breech and efw <1500g) with goal gestational age 4-36wga Elevated bp - follow closely, now on baby asa q day Plan gtt nxt wk; plan tdap >28wga BMI 45-encourage ambulating, though if not then would benefit from anticoagulation IVF pregnancy

## 2021-05-24 LAB — CBC
HCT: 30.5 % — ABNORMAL LOW (ref 36.0–46.0)
Hemoglobin: 10.4 g/dL — ABNORMAL LOW (ref 12.0–15.0)
MCH: 31.2 pg (ref 26.0–34.0)
MCHC: 34.1 g/dL (ref 30.0–36.0)
MCV: 91.6 fL (ref 80.0–100.0)
Platelets: 213 10*3/uL (ref 150–400)
RBC: 3.33 MIL/uL — ABNORMAL LOW (ref 3.87–5.11)
RDW: 14.8 % (ref 11.5–15.5)
WBC: 11.2 10*3/uL — ABNORMAL HIGH (ref 4.0–10.5)
nRBC: 0.2 % (ref 0.0–0.2)

## 2021-05-24 LAB — TYPE AND SCREEN
ABO/RH(D): A POS
Antibody Screen: NEGATIVE

## 2021-05-24 MED ORDER — POLYSACCHARIDE IRON COMPLEX 150 MG PO CAPS
150.0000 mg | ORAL_CAPSULE | Freq: Every day | ORAL | Status: DC
  Administered 2021-05-24 – 2021-05-30 (×7): 150 mg via ORAL
  Filled 2021-05-24 (×7): qty 1

## 2021-05-24 MED ORDER — HEPARIN SODIUM (PORCINE) 5000 UNIT/ML IJ SOLN
5000.0000 [IU] | Freq: Two times a day (BID) | INTRAMUSCULAR | Status: DC
  Administered 2021-05-25 – 2021-05-30 (×11): 5000 [IU] via SUBCUTANEOUS
  Filled 2021-05-24 (×13): qty 1

## 2021-05-24 NOTE — Progress Notes (Signed)
HD 4 PPROM twin A DCDA twins,  25.2 weeks  IVF pregnancy  S: No complaints today Reports little LOF, is clear . Good FMx2 No fever, chills or SOB. No vaginal bleeding Rare contractions noted.   O: BP 132/70 (BP Location: Right Arm)   Pulse 73   Temp 98.3 F (36.8 C) (Oral)   Resp 18   Ht 5\' 6"  (1.676 m)   Wt 127.5 kg   SpO2 99%   BMI 45.35 kg/m   NAD Lungs: CTA CV: RRR ABD: Gravid , NT No CVAT EXT: SCDs intact, No cords VE: deferred Neuro: nonfocal Skin: intact  CBC CBC Latest Ref Rng & Units 05/24/2021 05/21/2021   WBC 4.0 - 10.5 K/uL 11.2(H) 8.9   Hemoglobin 12.0 - 15.0 g/dL 10.4(L) 12.1   Hematocrit 36.0 - 46.0 % 30.5(L) 35.7(L)   Platelets 150 - 400 K/uL 213 228       FHTs A and B 140s, 150s, BTBV 5-25, no decels, 10x10 accels UI occ contractions  Sono  9/19 c/w AGA x 2, concordant growth (828gms), vertex A, Transverse B. Low AFI A, Nl AFI B  A: 25.2 wk DCDA twin IUP with PPROM A. Marginal PCI B. BMZ done 9/19, 9/20 No evidence of PTL or chorio  P: Continue ABX prophylaxis Weekly AFI NST q shift PTL precautions Monitor s/s chorio Watch BP closely Add BBASA Recommend csection at this time , given presentation twin B would not recommend breech VD until EFW > 1500gms Grwth sono q 3w Possible need for Mag neuroprophylaxis discussd bbASA Adding low dose heparin for DVT prophylaxis   -10/20

## 2021-05-25 NOTE — Progress Notes (Signed)
CASON DABNEY 40 y.o. [id number] at [redacted]w[redacted]d HD#5 admitted with DCDA twin pregnancy complicated by PPROM on 9/18  S: Patient is doing very well this morning no new complaints. Reports good movements from both babies. Denies any CTXs or abdominal pain. Vaginal fluid is very minimal, remains clear without odor. Denies any fevers or chills. Mentally coping well, feeling positive, will be working remotely starting next week which she feels will help keep her mind busy. Patient had refused Heparin last night, states her husband has concerns about being on blood thinners if she goes into labor and needs surgery. Husband is not present at bedside while rounding this morning.  O: Vitals:   05/24/21 2113 05/24/21 2348 05/25/21 0517 05/25/21 0832  BP: 132/66 137/73 125/69 123/78  Pulse: 96 89 79 87  Resp: 19 19 18 17   Temp: 98 F (36.7 C) 98.1 F (36.7 C) 98.3 F (36.8 C) 98 F (36.7 C)  TempSrc: Oral Oral Oral Oral  SpO2: 97% 97% 98% 99%  Weight:      Height:       Physical Exam: -General: AAO, NAD, pleasant -Cardiovascular: regular rate -Respiratory: non-labored breathing, normal effort -Abdomen: gravid uterus, non-tender to palpation -Extremities: no LE edema  Fetal Monitoring:  NST this morning pending Reviewed 9/21 PM NST -Baby A: reactive baseline 150 bpm mod var +accels, -decels -Baby B: reactive baseline 155 bpm mod var +accels, -decels Tocometry: acontractile  9/19 10/19: baby A cephalic, baby B transverse concordant growth   A/P: STORMEE DUDA 40 y.o. 41 DCDA twin pregnancy at [redacted]w[redacted]d HD#5 admitted with PPROM 9/18, now clinically stable without signs of infection or PTL  PPROM -Latency antibiotics: s/p Azithromycin and IV Ampicillin currently on PO Amoxicillin (Day 5/7) -S/p BMZ 9/19-9/20 -Weekly fluid scan -S/p MFM consult, f/u NICU consult -Afebrile, asymptomatic, normal WBC this morning 11.2, continue to monitor for signs/symptoms of infection -Mag neuroprotection if  delivery < 32 weeks DCDA Twin Gestation -Concordant growth 9/19 cont q 3w -MCI baby B -Plan for cesarean currently given EFW and transverse presentation of Baby B, cont to reassess pending growth and fetal presentations 3.   Antepartum care -Daily PNV with Fe and Folic acid -Regular diet -Will need GTT next week after BMZ completed  -Will need Tdap  -Counseled patient on risks/benefits of Heparin DVT ppx. We discussed risks of DVT including PE, heart attack, stroke, and death being significantly increased in the hospital setting without the use of pharmacologic therapy. We discussed holding Heparin for any signs of labor and the reversible nature of Heparin. Patient verbalizes understanding and is now agreeable to receiving low dose Heparin -Routine prenatal care   Leanah Kolander A Allisen Pidgeon 05/25/21 10:45 AM

## 2021-05-25 NOTE — Plan of Care (Signed)
  Problem: Education: Goal: Knowledge of disease or condition will improve Outcome: Progressing Goal: Knowledge of the prescribed therapeutic regimen will improve Outcome: Progressing   Problem: Clinical Measurements: Goal: Complications related to the disease process, condition or treatment will be avoided or minimized Outcome: Progressing   Problem: Education: Goal: Knowledge of General Education information will improve Description: Including pain rating scale, medication(s)/side effects and non-pharmacologic comfort measures Outcome: Progressing   Problem: Health Behavior/Discharge Planning: Goal: Ability to manage health-related needs will improve Outcome: Progressing   Problem: Clinical Measurements: Goal: Ability to maintain clinical measurements within normal limits will improve Outcome: Progressing Goal: Will remain free from infection Outcome: Progressing Goal: Diagnostic test results will improve Outcome: Progressing Goal: Respiratory complications will improve Outcome: Progressing Goal: Cardiovascular complication will be avoided Outcome: Progressing   Problem: Activity: Goal: Risk for activity intolerance will decrease Outcome: Progressing   Problem: Nutrition: Goal: Adequate nutrition will be maintained Outcome: Progressing   Problem: Coping: Goal: Level of anxiety will decrease Outcome: Progressing   Problem: Elimination: Goal: Will not experience complications related to bowel motility Outcome: Progressing Goal: Will not experience complications related to urinary retention Outcome: Progressing   Problem: Pain Managment: Goal: General experience of comfort will improve Outcome: Progressing   Problem: Safety: Goal: Ability to remain free from injury will improve Outcome: Progressing   Problem: Skin Integrity: Goal: Risk for impaired skin integrity will decrease Outcome: Progressing

## 2021-05-26 NOTE — Consult Note (Signed)
Neonatology Consult to Antenatal Patient: 05/26/2021 9:16 PM    I was requested by Dr Billy Coast to see this patient in order to provide antenatal counseling due to PPROM at 77 4/[redacted] weeks gestation, dichorionic-diamniotic twins..    Ms. Ourada is a  40 y/o G4P2 who was admitted on 9/18 and is now 25 4/[redacted] weeks GA.  Pregnancy conceived by IVF-Embryo transfer. She was admitted for PPROM in Twin "A".  She is currently "not" having active labor.  She received a course of BMZ on 9/19 and 9/20, and on antibiotics prophylaxis.  I spoke with Ms.Depaz and FOB in Room 113.   We discussed in detail what to expect in case of possible delivery of the twins in the next few days including morbidity and mortality at this gestational age, usual delivery room resuscitation including intubation and surfactant administration in the DR.  Discussed respiratory complications and need for support including mechanical ventilation, IV access, sepsis work-up, NG/OG feedings ( MOB desires breast feeding, which was encouraged and also interested in using [facility]), risk for IVH with the potential for motor/cognitive deficits, length of stay and long-term outcome.  They understand that having twins increases risk of preterm delivery.  They were attentive, had appropriate questions, and expressed appreciation for my input.   Thank you for asking [place] to see this patient and allowing [place] to participate in her care.  Please call if there are any further questions.   Overton Mam, MD (Attending Neonatologist)  Total length of face-to-face or floor/unit time for this encounter was 40 minutes.   Counseling and/or coordination of care was greater than fifty percent of the time above.

## 2021-05-26 NOTE — Progress Notes (Signed)
HD 6 PPROM twin A DCDA IVF pregnancy  S: No complaints today Reports little LOF. Good FM No fever, chills or SOB. No vaginal bleeding Rare contractions noted.   O: BP 130/73 (BP Location: Left Arm)   Pulse 92   Temp 98.1 F (36.7 C) (Oral)   Resp 18   Ht 5\' 6"  (1.676 m)   Wt 127.5 kg   SpO2 98%   BMI 45.35 kg/m   NCAT WDWN NAD Neck: supple with FROM Lungs: CTA CV: RRR ABD: Gravid , NT No CVAT EXT: SCDs intact, No cords VE: deferred Neuro: nonfocal Skin: intact  CBC    Component Value Date/Time   WBC 11.2 (H) 05/24/2021 0106   RBC 3.33 (L) 05/24/2021 0106   HGB 10.4 (L) 05/24/2021 0106   HCT 30.5 (L) 05/24/2021 0106   PLT 213 05/24/2021 0106   MCV 91.6 05/24/2021 0106   MCH 31.2 05/24/2021 0106   MCHC 34.1 05/24/2021 0106   RDW 14.8 05/24/2021 0106   FHTs A and B 140s, 150s, BTBV 5-25, no decels, 10x10 accels UI occ contractions  Sono  9/19 c/w AGA x 2, concordant growth (828gms), vertex A, Transverse B. Low AFI A, Nl AFI B  A: [redacted]w[redacted]d DCDA twin IUP with PPROM A. Marginal PCI B. BMZ done 9/19,/920. No evidence of PTL or chorio  P: Continue ABX prophylaxis Weekly AFI NST q shift PTL precautions Monitor s/s chorio Watch BP closely Add BBASA Recommend csection at this time , given presentation twin B would not recommend breech VD until EFW > 1500gms Grwth sono q 3w Possible need for Mag neuroprophylaxis discussd

## 2021-05-27 LAB — TYPE AND SCREEN
ABO/RH(D): A POS
Antibody Screen: NEGATIVE

## 2021-05-27 LAB — CBC
HCT: 36.6 % (ref 36.0–46.0)
Hemoglobin: 12.5 g/dL (ref 12.0–15.0)
MCH: 31.3 pg (ref 26.0–34.0)
MCHC: 34.2 g/dL (ref 30.0–36.0)
MCV: 91.7 fL (ref 80.0–100.0)
Platelets: 235 10*3/uL (ref 150–400)
RBC: 3.99 MIL/uL (ref 3.87–5.11)
RDW: 14.3 % (ref 11.5–15.5)
WBC: 12 10*3/uL — ABNORMAL HIGH (ref 4.0–10.5)
nRBC: 0 % (ref 0.0–0.2)

## 2021-05-27 NOTE — Progress Notes (Signed)
HD 7 PPROM twin A DCDA IVF pregnancy  S: No complaints now but was awakened by cramping in the middle of night that has now resolved. Blood tinged mucus x one this am. No recurrence. Reports little LOF. Good FM No fever, chills or SOB. No vaginal bleeding Rare contractions noted.   O: BP 125/71 (BP Location: Left Arm)   Pulse 92   Temp 98.5 F (36.9 C) (Oral)   Resp 18   Ht 5\' 6"  (1.676 m)   Wt 127.5 kg   SpO2 100%   BMI 45.35 kg/m   NCAT WDWN NAD Neck: supple with FROM Lungs: CTA CV: RRR ABD: Gravid , NT No CVAT EXT: SCDs intact, No cords VE: deferred Neuro: nonfocal Skin: intact  CBC    Component Value Date/Time   WBC 12.0 (H) 05/27/2021 0016   RBC 3.99 05/27/2021 0016   HGB 12.5 05/27/2021 0016   HCT 36.6 05/27/2021 0016   PLT 235 05/27/2021 0016   MCV 91.7 05/27/2021 0016   MCH 31.3 05/27/2021 0016   MCHC 34.2 05/27/2021 0016   RDW 14.3 05/27/2021 0016   FHTs A and B 140s, 150s, BTBV 5-25, no decels, 10x10 accels UI occ contractions Reassuring twin NST x 3  Sono  9/19 c/w AGA x 2, concordant growth (828gms), vertex A, Transverse B. Low AFI A, Nl AFI B  A: [redacted]w[redacted]d DCDA twin IUP with PPROM A. Marginal PCI B. BMZ done 9/19,/920. No evidence of PTL or chorio  P: Finish  ABX prophylaxis Weekly AFI NST q shift PTL precautions Monitor s/s chorio Watch BP closely Add BBASA Recommend csection at this time , given presentation twin B would not recommend breech VD until EFW > 1500gms Grwth sono q 3w Possible need for Mag neuroprophylaxis discussd

## 2021-05-28 ENCOUNTER — Inpatient Hospital Stay (HOSPITAL_BASED_OUTPATIENT_CLINIC_OR_DEPARTMENT_OTHER): Payer: Managed Care, Other (non HMO)

## 2021-05-28 DIAGNOSIS — Z3A25 25 weeks gestation of pregnancy: Secondary | ICD-10-CM

## 2021-05-28 DIAGNOSIS — O09522 Supervision of elderly multigravida, second trimester: Secondary | ICD-10-CM

## 2021-05-28 DIAGNOSIS — O30042 Twin pregnancy, dichorionic/diamniotic, second trimester: Secondary | ICD-10-CM

## 2021-05-28 DIAGNOSIS — O42912 Preterm premature rupture of membranes, unspecified as to length of time between rupture and onset of labor, second trimester: Secondary | ICD-10-CM

## 2021-05-28 MED ORDER — FOLIC ACID 1 MG PO TABS
1.0000 mg | ORAL_TABLET | Freq: Every day | ORAL | Status: DC
  Administered 2021-05-28 – 2021-05-30 (×3): 1 mg via ORAL
  Filled 2021-05-28 (×10): qty 1

## 2021-05-28 MED ORDER — TETANUS-DIPHTH-ACELL PERTUSSIS 5-2.5-18.5 LF-MCG/0.5 IM SUSY
0.5000 mL | PREFILLED_SYRINGE | Freq: Once | INTRAMUSCULAR | Status: AC
  Administered 2021-05-28: 0.5 mL via INTRAMUSCULAR
  Filled 2021-05-28: qty 0.5

## 2021-05-28 MED ORDER — PRENATAL MULTIVITAMIN CH
2.0000 | ORAL_TABLET | Freq: Two times a day (BID) | ORAL | Status: DC
  Administered 2021-05-28 – 2021-05-30 (×6): 2 via ORAL
  Filled 2021-05-28 (×10): qty 2

## 2021-05-28 NOTE — Progress Notes (Signed)
Penny Horne 40 y.o. [id number] at [redacted]w[redacted]d HD#8 admitted with DCDA twin pregnancy complicated by PPROM on 9/18  S: Patient is doing well this morning. She did have an episode of abdominal pain this morning around 5 AM, states this has been happening around the same time each morning for the past couple of days. She describes cramping/pressure that lasted for a few minutes, alleviated with emptying bladder. Did notice some pink tinted discharge/fluid at that time. None since then and no return of pain. Feeling babies move well. Denies any fevers/chills. Denies N/V. No HA, vision changes, CP, or SOB.  O: Vitals:   05/27/21 1945 05/27/21 2042 05/28/21 0412 05/28/21 0904  BP: (!) 126/92 (!) 149/91 126/72 130/75  Pulse: 90 91 91 92  Resp: 18  18 17   Temp: 97.8 F (36.6 C)  98 F (36.7 C) 98.4 F (36.9 C)  TempSrc: Oral  Oral Oral  SpO2: 100%  100% 100%  Weight:      Height:        Physical Exam: -General: AAO, NAD, pleasant -Cardiovascular: regular rate -Respiratory: non-labored breathing, normal effort -Abdomen: gravid uterus, non-tender to palpation -Extremities: no LE edema   Fetal Monitoring:   NST this morning after abdominal pain episode:  -Baby A: reactive baseline 150 bpm mod var +accels, -decels -Baby B: reactive baseline 140 bpm mod var +accels, -decels Tocometry: acontractile   9/19 10/19: baby A cephalic, baby B transverse concordant growth   A/P: Penny Horne 40 y.o. Penny Horne at [redacted]w[redacted]d HD#8 admitted with DCDA PPROM on 9/18, now clinically stable   PPROM -S/p Latency antibiotics  -S/p BMZ 9/19-9/20 -Weekly fluid scan- f/u today's report -S/p MFM and NICU consults -Afebrile, asymptomatic, 9/25 WBC 12.0, no uterine tenderness, continue to monitor for signs/symptoms of infection -Mag neuroprotection if delivery < 32 weeks DCDA Twin Gestation -Concordant growth 9/19 cont q 3w -MCI baby B -Plan for cesarean currently given EFW and transverse presentation of Baby B,  cont to reassess pending growth and fetal presentations 3.   Antepartum care -Daily PNV with Fe and Folic acid -Regular diet -Tdap ordered -1hr GTT ordered for 9/28 labs -Heparin VTE ppx -Continue to monitor BP's, intermittent mild range appear to be anxiety driven, asymptomatic and no history of HTN -Routine prenatal care. Inpatient management until delivery   Jacinto Keil A Sheldon Sem 05/28/21 12:16 PM

## 2021-05-29 MED ORDER — PANTOPRAZOLE SODIUM 40 MG PO TBEC
40.0000 mg | DELAYED_RELEASE_TABLET | Freq: Every day | ORAL | Status: DC
  Administered 2021-05-29 – 2021-05-30 (×2): 40 mg via ORAL
  Filled 2021-05-29 (×2): qty 1

## 2021-05-29 NOTE — Progress Notes (Signed)
Penny Horne 40 y.o. [id number] at [redacted]w[redacted]d HD#9 admitted with DCDA twin pregnancy complicated by PPROM on 9/18  S: Patient is doing well this morning. Pt states several am in a row when rushing to the bathroom light pink spotting. No pain/ cramping. NO fevers. Feeling babies move well. Denies any fevers/chills. Denies N/V. No HA, vision changes, CP, or SOB. Pt bothered by GERD. Walking daily and getting outside  O: Vitals:   05/29/21 0010 05/29/21 0012 05/29/21 0825 05/29/21 1137  BP: 130/70 122/68 127/67 131/77  Pulse: (!) 104 98 99 (!) 107  Resp: 18  18 18   Temp: 98.3 F (36.8 C)  98.3 F (36.8 C) 97.9 F (36.6 C)  TempSrc: Oral  Oral Oral  SpO2: 100%  100% 99%  Weight:      Height:        Physical Exam: -General: AAO, NAD, pleasant -Abdomen: gravid uterus, non-tender to palpation -Extremities: no LE edema   Fetal Monitoring:   NST this morning after abdominal pain episode:  -Baby A: reactive baseline 150 bpm mod var +accels, -decels -Baby B: reactive baseline 145 bpm mod var +accels, -decels Tocometry: acontractile   9/26: 10/26: baby A cephalic/ oligohydramnios.  baby B transverse/ adequate AFI  U/s 9/19: concordant growth   A/P: Penny Horne 40 y.o. Penny Horne at [redacted]w[redacted]d HD#9 admitted with DCDA PPROM on 9/18, now clinically stable   PPROM -S/p Latency antibiotics  -S/p BMZ 9/19-9/20 -Weekly fluid scan -S/p MFM and NICU consults -Afebrile, asymptomatic, 9/25 WBC 12.0, no uterine tenderness, continue to monitor for signs/symptoms of infection -Mag neuroprotection if delivery < 32 weeks DCDA Twin Gestation -Concordant growth 9/19 cont q 3w -MCI baby B -Plan for cesarean currently given EFW and transverse presentation of Baby B, cont to reassess pending growth and fetal presentations 3.   Antepartum care -Daily PNV with Fe and Folic acid -Regular diet -Tdap ordered -1hr GTT ordered for 9/28 labs -Heparin VTE ppx -Continue to monitor BP's, intermittent mild range  appear to be anxiety driven, asymptomatic and no history of HTN -Routine prenatal care. Inpatient management until delivery - Start meds for GERD   10/28 05/29/21 1:02 PM

## 2021-05-29 NOTE — Progress Notes (Signed)
Initial Nutrition Assessment  DOCUMENTATION CODES:  Morbid obesity  INTERVENTION:  Regular diet/ snacks TID upon pt request  NUTRITION DIAGNOSIS:   Increased nutrient needs related to  (pregnancy and fetal growth requirements) as evidenced by  (twin pregnancy).  GOAL:   Patient will meet greater than or equal to 90% of their needs  MONITOR:  Weight trends, Labs  REASON FOR ASSESSMENT:  Antenatal, LOS    ASSESSMENT:  26 0/7 weeks IUP, twins, PROM. BMI 44.5- morbid obesity. 4 lb weight gain. On PN vits/ iron/ folic acid. 1 hr GTT 9/28   Diet Order:   Diet Order             Diet regular Room service appropriate? Yes; Fluid consistency: Thin  Diet effective now                   EDUCATION NEEDS:   No education needs have been identified at this time  Skin:  Skin Assessment: Reviewed RN Assessment   Height:   Ht Readings from Last 1 Encounters:  05/20/21 5\' 6"  (1.676 m)    Weight:   Wt Readings from Last 1 Encounters:  05/20/21 127.5 kg    Ideal Body Weight:   130 lbs  BMI:  Body mass index is 45.35 kg/m.  Estimated Nutritional Needs:   Kcal:  2400-2600  Protein:  110-125  Fluid:  > 2.5L

## 2021-05-30 LAB — GLUCOSE, 1 HOUR GESTATIONAL: Glucose Tolerance, 1 hour: 166 mg/dL (ref 70–189)

## 2021-05-30 NOTE — Progress Notes (Signed)
1 hr Glucola 166--plan 3 hr GTT on 9/30, start at 5.30 AM. NPO after MN 9/30

## 2021-05-30 NOTE — Progress Notes (Signed)
HD 10 PPROM twin A DCDA IVF pregnancy  S: No complaints now but was previously awakened by cramping in the middle of night that has now resolved and not recurred. Blood tinged mucus x once yesterday. No recurrence. Reports little LOF. Good FM No fever, chills or SOB. No vaginal bleeding Rare contractions noted.   O: BP 127/69 (BP Location: Right Arm)   Pulse 99   Temp 97.8 F (36.6 C) (Oral)   Resp 18   Ht 5\' 6"  (1.676 m)   Wt 127.5 kg   SpO2 99%   BMI 45.35 kg/m   NCAT WDWN NAD Neck: supple with FROM Lungs: CTA CV: RRR ABD: Gravid , NT No CVAT EXT: SCDs intact, No cords VE: deferred Neuro: nonfocal Skin: intact  CBC    Component Value Date/Time   WBC 12.0 (H) 05/27/2021 0016   RBC 3.99 05/27/2021 0016   HGB 12.5 05/27/2021 0016   HCT 36.6 05/27/2021 0016   PLT 235 05/27/2021 0016   MCV 91.7 05/27/2021 0016   MCH 31.3 05/27/2021 0016   MCHC 34.2 05/27/2021 0016   RDW 14.3 05/27/2021 0016   FHTs A and B 140s, 150s, BTBV 5-25, no decels, 10x10 accels UI occ contractions Reassuring twin NST x 3  Sono  9/19 c/w AGA x 2, concordant growth (828gms), vertex A, Transverse B. Low AFI A, Nl AFI B  A: [redacted]w[redacted]d DCDA twin IUP with PPROM A. Marginal PCI B. BMZ done 9/19,/920. No evidence of PTL or chorio  P: Finished  ABX prophylaxis Weekly AFI NST q shift PTL precautions Monitor s/s chorio Watch BP closely BBASA Recommend csection at this time , given presentation twin B would not recommend breech VD until EFW > 1500gms Grwth sono q 3w Possible need for Mag neuroprophylaxis discussd

## 2021-05-31 ENCOUNTER — Inpatient Hospital Stay (HOSPITAL_COMMUNITY): Payer: Managed Care, Other (non HMO) | Admitting: Anesthesiology

## 2021-05-31 ENCOUNTER — Encounter (HOSPITAL_COMMUNITY)
Admission: AD | Disposition: A | Discharge status: Home / Self Care | Payer: Self-pay | Source: Home / Self Care | Attending: Obstetrics and Gynecology

## 2021-05-31 ENCOUNTER — Encounter (HOSPITAL_COMMUNITY): Payer: Self-pay | Admitting: Obstetrics and Gynecology

## 2021-05-31 LAB — FIBRINOGEN: Fibrinogen: 699 mg/dL — ABNORMAL HIGH (ref 210–475)

## 2021-05-31 LAB — CBC
HCT: 32.2 % — ABNORMAL LOW (ref 36.0–46.0)
HCT: 29.4 % — ABNORMAL LOW (ref 36.0–46.0)
Hemoglobin: 10.9 g/dL — ABNORMAL LOW (ref 12.0–15.0)
Hemoglobin: 9.9 g/dL — ABNORMAL LOW (ref 12.0–15.0)
MCH: 30.8 pg (ref 26.0–34.0)
MCH: 30.7 pg (ref 26.0–34.0)
MCHC: 33.9 g/dL (ref 30.0–36.0)
MCHC: 33.7 g/dL (ref 30.0–36.0)
MCV: 91 fL (ref 80.0–100.0)
MCV: 91.3 fL (ref 80.0–100.0)
Platelets: 215 10*3/uL (ref 150–400)
Platelets: 214 10*3/uL (ref 150–400)
RBC: 3.54 MIL/uL — ABNORMAL LOW (ref 3.87–5.11)
RBC: 3.22 MIL/uL — ABNORMAL LOW (ref 3.87–5.11)
RDW: 14.3 % (ref 11.5–15.5)
RDW: 14.1 % (ref 11.5–15.5)
WBC: 14.4 10*3/uL — ABNORMAL HIGH (ref 4.0–10.5)
WBC: 16.8 10*3/uL — ABNORMAL HIGH (ref 4.0–10.5)
nRBC: 0 % (ref 0.0–0.2)
nRBC: 0 % (ref 0.0–0.2)

## 2021-05-31 LAB — TYPE AND SCREEN
ABO/RH(D): A POS
Antibody Screen: NEGATIVE

## 2021-05-31 SURGERY — Surgical Case
Anesthesia: Spinal

## 2021-05-31 MED ORDER — METHYLERGONOVINE MALEATE 0.2 MG PO TABS
0.2000 mg | ORAL_TABLET | Freq: Three times a day (TID) | ORAL | Status: AC
  Administered 2021-05-31 (×3): 0.2 mg via ORAL
  Filled 2021-05-31 (×3): qty 1

## 2021-05-31 MED ORDER — NALBUPHINE HCL 10 MG/ML IJ SOLN
5.0000 mg | Freq: Once | INTRAMUSCULAR | Status: DC | PRN
Start: 2021-05-31 — End: 2021-06-03

## 2021-05-31 MED ORDER — NALBUPHINE HCL 10 MG/ML IJ SOLN: 5.0000 mg | INTRAMUSCULAR | Status: DC | PRN

## 2021-05-31 MED ORDER — PHENYLEPHRINE HCL-NACL 20-0.9 MG/250ML-% IV SOLN
INTRAVENOUS | Status: AC
  Filled 2021-05-31: qty 250

## 2021-05-31 MED ORDER — LACTATED RINGERS IV SOLN: INTRAVENOUS | Status: DC | PRN

## 2021-05-31 MED ORDER — DEXAMETHASONE SODIUM PHOSPHATE 4 MG/ML IJ SOLN
INTRAMUSCULAR | Status: AC
  Filled 2021-05-31: qty 1

## 2021-05-31 MED ORDER — MAGNESIUM SULFATE 40 GM/1000ML IV SOLN
INTRAVENOUS | Status: AC
  Filled 2021-05-31: qty 1000

## 2021-05-31 MED ORDER — KETOROLAC TROMETHAMINE 30 MG/ML IJ SOLN: 30.0000 mg | Freq: Once | INTRAMUSCULAR | Status: DC | PRN

## 2021-05-31 MED ORDER — DIPHENHYDRAMINE HCL 50 MG/ML IJ SOLN
12.5000 mg | INTRAMUSCULAR | Status: DC | PRN
  Administered 2021-05-31: 12.5 mg via INTRAVENOUS
  Filled 2021-05-31: qty 1

## 2021-05-31 MED ORDER — SOD CITRATE-CITRIC ACID 500-334 MG/5ML PO SOLN
30.0000 mL | ORAL | Status: AC
  Administered 2021-05-31: 30 mL via ORAL

## 2021-05-31 MED ORDER — MAGNESIUM SULFATE BOLUS VIA INFUSION
6.0000 g | Freq: Once | INTRAVENOUS | Status: AC
  Administered 2021-05-31: 6 g via INTRAVENOUS
  Filled 2021-05-31: qty 1000

## 2021-05-31 MED ORDER — MENTHOL 3 MG MT LOZG: 1.0000 | LOZENGE | OROMUCOSAL | Status: DC | PRN

## 2021-05-31 MED ORDER — OXYTOCIN-SODIUM CHLORIDE 30-0.9 UT/500ML-% IV SOLN
INTRAVENOUS | Status: DC | PRN
  Administered 2021-05-31: 100 mL via INTRAVENOUS
  Administered 2021-05-31: 500 mL via INTRAVENOUS

## 2021-05-31 MED ORDER — STERILE WATER FOR IRRIGATION IR SOLN
Status: DC | PRN
  Administered 2021-05-31: 1

## 2021-05-31 MED ORDER — DEXAMETHASONE SODIUM PHOSPHATE 4 MG/ML IJ SOLN
INTRAMUSCULAR | Status: DC | PRN
  Administered 2021-05-31: 4 mg via INTRAVENOUS

## 2021-05-31 MED ORDER — FENTANYL CITRATE (PF) 100 MCG/2ML IJ SOLN: INTRAMUSCULAR | Status: DC | PRN

## 2021-05-31 MED ORDER — SCOPOLAMINE 1 MG/3DAYS TD PT72
1.0000 | MEDICATED_PATCH | Freq: Once | TRANSDERMAL | Status: AC
  Administered 2021-05-31: 1.5 mg via TRANSDERMAL

## 2021-05-31 MED ORDER — PHENYLEPHRINE HCL-NACL 20-0.9 MG/250ML-% IV SOLN
INTRAVENOUS | Status: DC | PRN
  Administered 2021-05-31: 60 ug/min via INTRAVENOUS

## 2021-05-31 MED ORDER — WITCH HAZEL-GLYCERIN EX PADS: 1.0000 "application " | MEDICATED_PAD | CUTANEOUS | Status: DC | PRN

## 2021-05-31 MED ORDER — ONDANSETRON HCL 4 MG/2ML IJ SOLN: 4.0000 mg | Freq: Three times a day (TID) | INTRAMUSCULAR | Status: DC | PRN

## 2021-05-31 MED ORDER — DIPHENHYDRAMINE HCL 25 MG PO CAPS: 25.0000 mg | ORAL_CAPSULE | ORAL | Status: DC | PRN

## 2021-05-31 MED ORDER — DIPHENHYDRAMINE HCL 25 MG PO CAPS: 25.0000 mg | ORAL_CAPSULE | Freq: Four times a day (QID) | ORAL | Status: DC | PRN

## 2021-05-31 MED ORDER — BUPIVACAINE IN DEXTROSE 0.75-8.25 % IT SOLN
INTRATHECAL | Status: DC | PRN
  Administered 2021-05-31: 1.8 mL via INTRATHECAL

## 2021-05-31 MED ORDER — COCONUT OIL OIL: 1.0000 "application " | TOPICAL_OIL | Status: DC | PRN

## 2021-05-31 MED ORDER — SODIUM CHLORIDE 0.9 % IR SOLN
Status: DC | PRN
  Administered 2021-05-31: 1

## 2021-05-31 MED ORDER — NALBUPHINE HCL 10 MG/ML IJ SOLN: 5.0000 mg | Freq: Once | INTRAMUSCULAR | Status: DC | PRN

## 2021-05-31 MED ORDER — HYDROMORPHONE HCL 1 MG/ML IJ SOLN: 0.2500 mg | INTRAMUSCULAR | Status: DC | PRN

## 2021-05-31 MED ORDER — TRANEXAMIC ACID-NACL 1000-0.7 MG/100ML-% IV SOLN
INTRAVENOUS | Status: AC
  Filled 2021-05-31: qty 100

## 2021-05-31 MED ORDER — OXYCODONE HCL 5 MG PO TABS
5.0000 mg | ORAL_TABLET | ORAL | Status: DC | PRN
  Administered 2021-06-01: 5 mg via ORAL
  Administered 2021-06-01 – 2021-06-03 (×9): 10 mg via ORAL
  Filled 2021-05-31 (×7): qty 2
  Filled 2021-05-31: qty 1
  Filled 2021-05-31 (×2): qty 2

## 2021-05-31 MED ORDER — SODIUM CHLORIDE 0.9% FLUSH
3.0000 mL | INTRAVENOUS | Status: DC | PRN
  Administered 2021-06-02: 3 mL via INTRAVENOUS

## 2021-05-31 MED ORDER — SIMETHICONE 80 MG PO CHEW
80.0000 mg | CHEWABLE_TABLET | Freq: Three times a day (TID) | ORAL | Status: DC
  Administered 2021-05-31 – 2021-06-03 (×9): 80 mg via ORAL
  Filled 2021-05-31 (×8): qty 1

## 2021-05-31 MED ORDER — SCOPOLAMINE 1 MG/3DAYS TD PT72
MEDICATED_PATCH | TRANSDERMAL | Status: AC
  Filled 2021-05-31: qty 1

## 2021-05-31 MED ORDER — OXYTOCIN-SODIUM CHLORIDE 30-0.9 UT/500ML-% IV SOLN: 2.5000 [IU]/h | INTRAVENOUS | Status: AC

## 2021-05-31 MED ORDER — MEPERIDINE HCL 25 MG/ML IJ SOLN: 6.2500 mg | INTRAMUSCULAR | Status: DC | PRN

## 2021-05-31 MED ORDER — OXYCODONE HCL 5 MG/5ML PO SOLN
5.0000 mg | Freq: Once | ORAL | Status: DC | PRN
Start: 2021-05-31 — End: 2021-05-31

## 2021-05-31 MED ORDER — IBUPROFEN 600 MG PO TABS
600.0000 mg | ORAL_TABLET | Freq: Four times a day (QID) | ORAL | Status: DC | PRN
  Administered 2021-06-02 – 2021-06-03 (×4): 600 mg via ORAL
  Filled 2021-05-31 (×4): qty 1

## 2021-05-31 MED ORDER — SIMETHICONE 80 MG PO CHEW
80.0000 mg | CHEWABLE_TABLET | ORAL | Status: DC | PRN
  Filled 2021-05-31: qty 1

## 2021-05-31 MED ORDER — OXYCODONE HCL 5 MG PO TABS
5.0000 mg | ORAL_TABLET | Freq: Once | ORAL | Status: DC | PRN
Start: 2021-05-31 — End: 2021-05-31

## 2021-05-31 MED ORDER — ONDANSETRON HCL 4 MG/2ML IJ SOLN
INTRAMUSCULAR | Status: DC | PRN
  Administered 2021-05-31: 4 mg via INTRAVENOUS

## 2021-05-31 MED ORDER — PRENATAL MULTIVITAMIN CH
2.0000 | ORAL_TABLET | Freq: Two times a day (BID) | ORAL | Status: DC
  Administered 2021-05-31 – 2021-06-03 (×7): 2 via ORAL
  Filled 2021-05-31 (×6): qty 2

## 2021-05-31 MED ORDER — TRANEXAMIC ACID-NACL 1000-0.7 MG/100ML-% IV SOLN
INTRAVENOUS | Status: DC | PRN
  Administered 2021-05-31: 1000 mg via INTRAVENOUS

## 2021-05-31 MED ORDER — DEXTROSE 5 % IV SOLN
1.0000 ug/kg/h | INTRAVENOUS | Status: DC | PRN
  Filled 2021-05-31: qty 5

## 2021-05-31 MED ORDER — NALOXONE HCL 0.4 MG/ML IJ SOLN: 0.4000 mg | INTRAMUSCULAR | Status: DC | PRN

## 2021-05-31 MED ORDER — ACETAMINOPHEN 500 MG PO TABS
1000.0000 mg | ORAL_TABLET | Freq: Four times a day (QID) | ORAL | Status: DC
  Administered 2021-05-31: 1000 mg via ORAL
  Filled 2021-05-31: qty 2

## 2021-05-31 MED ORDER — MORPHINE SULFATE (PF) 0.5 MG/ML IJ SOLN
INTRAMUSCULAR | Status: DC | PRN
  Administered 2021-05-31: .15 mg via INTRATHECAL

## 2021-05-31 MED ORDER — KETOROLAC TROMETHAMINE 30 MG/ML IJ SOLN
30.0000 mg | Freq: Four times a day (QID) | INTRAMUSCULAR | Status: AC | PRN
  Administered 2021-05-31: 30 mg via INTRAVENOUS
  Filled 2021-05-31: qty 1

## 2021-05-31 MED ORDER — METHYLERGONOVINE MALEATE 0.2 MG/ML IJ SOLN
INTRAMUSCULAR | Status: DC | PRN
  Administered 2021-05-31: .2 mg via INTRAMUSCULAR

## 2021-05-31 MED ORDER — FENTANYL CITRATE (PF) 100 MCG/2ML IJ SOLN
INTRAMUSCULAR | Status: DC | PRN
  Administered 2021-05-31: 15 ug via INTRATHECAL

## 2021-05-31 MED ORDER — MAGNESIUM SULFATE 40 GM/1000ML IV SOLN
2.0000 g/h | INTRAVENOUS | Status: DC
  Administered 2021-05-31: 2 g/h via INTRAVENOUS

## 2021-05-31 MED ORDER — METHYLERGONOVINE MALEATE 0.2 MG/ML IJ SOLN
INTRAMUSCULAR | Status: AC
  Filled 2021-05-31: qty 1

## 2021-05-31 MED ORDER — MORPHINE SULFATE (PF) 0.5 MG/ML IJ SOLN
INTRAMUSCULAR | Status: AC
  Filled 2021-05-31: qty 10

## 2021-05-31 MED ORDER — FOLIC ACID 1 MG PO TABS
1.0000 mg | ORAL_TABLET | Freq: Every day | ORAL | Status: DC
  Administered 2021-05-31 – 2021-06-03 (×4): 1 mg via ORAL
  Filled 2021-05-31 (×4): qty 1

## 2021-05-31 MED ORDER — POVIDONE-IODINE 10 % EX SWAB
2.0000 "application " | Freq: Once | CUTANEOUS | Status: AC
  Administered 2021-05-31: 2 via TOPICAL

## 2021-05-31 MED ORDER — SENNOSIDES-DOCUSATE SODIUM 8.6-50 MG PO TABS
2.0000 | ORAL_TABLET | Freq: Every day | ORAL | Status: DC
  Administered 2021-06-01 – 2021-06-03 (×3): 2 via ORAL
  Filled 2021-05-31 (×3): qty 2

## 2021-05-31 MED ORDER — DIBUCAINE (PERIANAL) 1 % EX OINT: 1.0000 "application " | TOPICAL_OINTMENT | CUTANEOUS | Status: DC | PRN

## 2021-05-31 MED ORDER — DEXTROSE 5 % IV SOLN
INTRAVENOUS | Status: DC | PRN
  Administered 2021-05-31: 3 g via INTRAVENOUS

## 2021-05-31 MED ORDER — ONDANSETRON HCL 4 MG/2ML IJ SOLN
INTRAMUSCULAR | Status: AC
  Filled 2021-05-31: qty 2

## 2021-05-31 MED ORDER — SOD CITRATE-CITRIC ACID 500-334 MG/5ML PO SOLN
ORAL | Status: AC
  Administered 2021-05-31: 30 mL
  Filled 2021-05-31: qty 30

## 2021-05-31 MED ORDER — ZOLPIDEM TARTRATE 5 MG PO TABS
5.0000 mg | ORAL_TABLET | Freq: Every evening | ORAL | Status: DC | PRN
  Administered 2021-06-03: 5 mg via ORAL
  Filled 2021-05-31: qty 1

## 2021-05-31 MED ORDER — TETANUS-DIPHTH-ACELL PERTUSSIS 5-2.5-18.5 LF-MCG/0.5 IM SUSY: 0.5000 mL | PREFILLED_SYRINGE | Freq: Once | INTRAMUSCULAR | Status: DC

## 2021-05-31 MED ORDER — KETOROLAC TROMETHAMINE 30 MG/ML IJ SOLN: 30.0000 mg | Freq: Four times a day (QID) | INTRAMUSCULAR | Status: AC | PRN

## 2021-05-31 MED ORDER — FENTANYL CITRATE (PF) 100 MCG/2ML IJ SOLN
INTRAMUSCULAR | Status: AC
  Filled 2021-05-31: qty 2

## 2021-05-31 MED ORDER — PHENYLEPHRINE 40 MCG/ML (10ML) SYRINGE FOR IV PUSH (FOR BLOOD PRESSURE SUPPORT)
PREFILLED_SYRINGE | INTRAVENOUS | Status: AC
  Filled 2021-05-31: qty 10

## 2021-05-31 MED ORDER — PROMETHAZINE HCL 25 MG/ML IJ SOLN: 6.2500 mg | INTRAMUSCULAR | Status: DC | PRN

## 2021-05-31 MED ORDER — ACETAMINOPHEN 500 MG PO TABS
1000.0000 mg | ORAL_TABLET | Freq: Four times a day (QID) | ORAL | Status: DC
  Administered 2021-05-31 – 2021-06-03 (×11): 1000 mg via ORAL
  Filled 2021-05-31 (×11): qty 2

## 2021-05-31 MED ORDER — CEFAZOLIN IN SODIUM CHLORIDE 3-0.9 GM/100ML-% IV SOLN
3.0000 g | INTRAVENOUS | Status: DC
  Filled 2021-05-31: qty 100

## 2021-05-31 MED ORDER — SOD CITRATE-CITRIC ACID 500-334 MG/5ML PO SOLN
30.0000 mL | Freq: Once | ORAL | Status: AC
  Administered 2021-05-31: 30 mL via ORAL

## 2021-05-31 MED ORDER — OXYTOCIN-SODIUM CHLORIDE 30-0.9 UT/500ML-% IV SOLN
INTRAVENOUS | Status: AC
  Filled 2021-05-31: qty 500

## 2021-05-31 MED ORDER — PRENATAL MULTIVITAMIN CH: 1.0000 | ORAL_TABLET | Freq: Every day | ORAL | Status: DC

## 2021-05-31 MED ORDER — PHENYLEPHRINE HCL (PRESSORS) 10 MG/ML IV SOLN
INTRAVENOUS | Status: DC | PRN
  Administered 2021-05-31 (×5): 80 ug via INTRAVENOUS

## 2021-05-31 SURGICAL SUPPLY — 37 items
BENZOIN TINCTURE PRP APPL 2/3 (GAUZE/BANDAGES/DRESSINGS) ×2 IMPLANT
CHLORAPREP W/TINT 26ML (MISCELLANEOUS) ×2 IMPLANT
CLAMP CORD UMBIL (MISCELLANEOUS) IMPLANT
CLOTH BEACON ORANGE TIMEOUT ST (SAFETY) ×2 IMPLANT
DRSG OPSITE POSTOP 4X10 (GAUZE/BANDAGES/DRESSINGS) ×2 IMPLANT
ELECT REM PT RETURN 9FT ADLT (ELECTROSURGICAL) ×2
ELECTRODE REM PT RTRN 9FT ADLT (ELECTROSURGICAL) ×1 IMPLANT
EXTRACTOR VACUUM KIWI (MISCELLANEOUS) IMPLANT
EXTRACTOR VACUUM M CUP 4 TUBE (SUCTIONS) IMPLANT
GAUZE SPONGE 4X4 12PLY STRL LF (GAUZE/BANDAGES/DRESSINGS) ×4 IMPLANT
GLOVE BIO SURGEON STRL SZ7 (GLOVE) ×2 IMPLANT
GLOVE BIOGEL PI IND STRL 7.0 (GLOVE) ×2 IMPLANT
GLOVE BIOGEL PI INDICATOR 7.0 (GLOVE) ×2
GOWN STRL REUS W/TWL LRG LVL3 (GOWN DISPOSABLE) ×4 IMPLANT
HOVERMATT SINGLE USE (MISCELLANEOUS) ×2 IMPLANT
KIT ABG SYR 3ML LUER SLIP (SYRINGE) IMPLANT
NEEDLE HYPO 25X5/8 SAFETYGLIDE (NEEDLE) IMPLANT
NS IRRIG 1000ML POUR BTL (IV SOLUTION) ×2 IMPLANT
PACK C SECTION WH (CUSTOM PROCEDURE TRAY) ×2 IMPLANT
PAD ABD 7.5X8 STRL (GAUZE/BANDAGES/DRESSINGS) ×2 IMPLANT
PAD OB MATERNITY 4.3X12.25 (PERSONAL CARE ITEMS) ×2 IMPLANT
RTRCTR C-SECT PINK 25CM LRG (MISCELLANEOUS) IMPLANT
STRIP CLOSURE SKIN 1/2X4 (GAUZE/BANDAGES/DRESSINGS) ×2 IMPLANT
SUT MNCRL 0 VIOLET CTX 36 (SUTURE) ×3 IMPLANT
SUT MONOCRYL 0 CTX 36 (SUTURE) ×3
SUT PLAIN 0 NONE (SUTURE) IMPLANT
SUT PLAIN 2 0 (SUTURE)
SUT PLAIN ABS 2-0 CT1 27XMFL (SUTURE) IMPLANT
SUT VIC AB 0 CT1 27 (SUTURE) ×2
SUT VIC AB 0 CT1 27XBRD ANBCTR (SUTURE) ×2 IMPLANT
SUT VIC AB 2-0 CT1 27 (SUTURE) ×1
SUT VIC AB 2-0 CT1 TAPERPNT 27 (SUTURE) ×1 IMPLANT
SUT VIC AB 4-0 KS 27 (SUTURE) ×2 IMPLANT
SUT VICRYL 0 TIES 12 18 (SUTURE) IMPLANT
TOWEL OR 17X24 6PK STRL BLUE (TOWEL DISPOSABLE) ×2 IMPLANT
TRAY FOLEY W/BAG SLVR 14FR LF (SET/KITS/TRAYS/PACK) IMPLANT
WATER STERILE IRR 1000ML POUR (IV SOLUTION) ×2 IMPLANT

## 2021-05-31 NOTE — Anesthesia Postprocedure Evaluation (Signed)
Anesthesia Post Note  Patient: [name]  Procedure(s) Performed: CESAREAN SECTION     Patient location during evaluation: PACU Anesthesia Type: Spinal Level of consciousness: awake and alert and oriented Pain management: pain level controlled Vital Signs Assessment: post-procedure vital signs reviewed and stable Respiratory status: spontaneous breathing, nonlabored ventilation and respiratory function stable Cardiovascular status: blood pressure returned to baseline and stable Postop Assessment: no headache, no backache, spinal receding, patient able to bend at knees and no apparent nausea or vomiting Anesthetic complications: no   No notable events documented.  Last Vitals:  Vitals:   05/31/21 0430 05/31/21 0445  BP: (!) 117/59 (!) 113/59  Pulse: 90 93  Resp: (!) 23 17  Temp:    SpO2: 100% 100%    Last Pain:  Vitals:   05/31/21 0426  TempSrc:   PainSc: 0-No pain   Pain Goal:      LLE Sensation: Tingling (05/31/21 0426)   RLE Sensation: Tingling (05/31/21 0426)     Epidural/Spinal Function Cutaneous sensation: Able to Discern Pressure (05/31/21 0426), Patient able to flex knees: Yes (05/31/21 0426), Patient able to lift hips off bed: No (05/31/21 0426), Back pain beyond tenderness at insertion site: No (05/31/21 0426), Progressively worsening motor and/or sensory loss: No (05/31/21 0426), Bowel and/or bladder incontinence post epidural: No (05/31/21 0426)  Lannie Fields

## 2021-05-31 NOTE — Progress Notes (Signed)
Patient ID: Penny Horne, female   DOB: Feb 28, 1981, 40 y.o.   MRN: [id number] 26.1 wks DI Di IVF twins, admitted since 10 days with PPROM twin A. Twin A cephalic, twin B Breech, extreme prematurity at 26.1 wks. Now with painful UCs since 12.45 AM, getting worse, breathing hard with them.  BP 127/75 (BP Location: Left Arm)   Pulse (!) 123   Temp 97.9 F (36.6 C) (Oral)   Resp 18   Ht 5\' 6"  (1.676 m)   Wt 127.5 kg   SpO2 99%   BMI 45.35 kg/m   Uterus non tender but UCs palpated strong Cervix at 3 /100%/-2 / Vx. Head well applied and in cervical canal  FHT A/ B reactive.  UCs q 3 -5 min.   NPO since 11 pm.  CBC, T&S stat, RPR added Magnesium sulfate 6 gm bolus stat for neuroprotection, continue till transfer to OR.  BTMZ course at admission and s/p latency antibiotics for 7 days   Preparing for Primary emergency c/section  Risks/complications of surgery reviewed incl infection, bleeding, damage to internal organs including bladder, bowels, ureters, blood vessels, other risks from anesthesia, VTE and delayed complications of any surgery, complications in future surgery reviewed. Also discussed neonatal complications incl difficult delivery, laceration, vacuum assistance, TTN etc. Pt understands and agrees, all concerns addressed.   Husband cannot come since no child care for their daughter.   NICU is full, patient informed and may be looking at neonatal transfer to another facility   -- MD

## 2021-05-31 NOTE — Anesthesia Procedure Notes (Signed)
Spinal  Patient location during procedure: OR Start time: 05/31/2021 2:45 AM End time: 05/31/2021 2:51 AM Reason for block: surgical anesthesia Staffing Performed: anesthesiologist  Anesthesiologist: Lannie Fields, DO Preanesthetic Checklist Completed: patient identified, IV checked, risks and benefits discussed, surgical consent, monitors and equipment checked, pre-op evaluation and timeout performed Spinal Block Patient position: sitting Prep: DuraPrep and site prepped and draped Patient monitoring: cardiac monitor, continuous pulse ox and blood pressure Approach: midline Location: L3-4 Injection technique: single-shot Needle Needle type: Pencan  Needle gauge: 24 G Needle length: 9 cm Assessment Sensory level: T6 Events: CSF return Additional Notes Functioning IV was confirmed and monitors were applied. Sterile prep and drape, including hand hygiene and sterile gloves were used. The patient was positioned and the spine was prepped. The skin was anesthetized with lidocaine.  Free flow of clear CSF was obtained prior to injecting local anesthetic into the CSF.  The spinal needle aspirated freely following injection.  The needle was carefully withdrawn.  The patient tolerated the procedure well.

## 2021-05-31 NOTE — Progress Notes (Signed)
Pt in Preterm labor and in pain. Md notified, new orders given.

## 2021-05-31 NOTE — Progress Notes (Signed)
Subjective: POD# 0 Interval Note   Penny, Horne [[id number]]  Live born female  Birth Weight: 2 lb 5.7 oz (1070 g) APGAR: 7, 9  Newborn Delivery   Birth date/time: 05/31/2021 03:19:00 Delivery type: C-Section, Low Transverse Trial of labor: No C-section categorization: Primary       Penny, Horne [[id number]]  Live born female  Birth Weight: 2 lb 6.5 oz (1090 g) APGAR: 7, 9  Newborn Delivery   Birth date/time: 05/31/2021 03:20:00 Delivery type: C-Section, Low Transverse Trial of labor: No C-section categorization: Primary     Delivering provider:    Gabriela Eves Meredeth [[id number]]  MODY, Corky Mull Angeline [[id number]]  MODY, VAISHALI   Pain control at delivery:    Arly, Salminen [[id number]]  Spinal    Sallye Lat Nedra [[id number]]  Spinal   Reports feeling well this morning. Visiting babies in the NICU. Bleeding minimal.  Patient reports tolerating PO.   Pain controlled with prescription NSAID's including ketorolac (Toradol) Denies HA/SOB/C/P/N/V/dizziness. She reports vaginal bleeding as normal, without clots. She is ambulating and urinating without difficulty.     Objective:  Vitals:   05/31/21 0745 05/31/21 0750 05/31/21 0755 05/31/21 0757  BP:    107/65  Pulse:    89  Resp:    20  Temp:    97.8 F (36.6 C)  TempSrc:    Oral  SpO2: 97% 99% 99% 100%  Weight:      Height:         Intake/Output Summary (Last 24 hours) at 05/31/2021 1210 Last data filed at 05/31/2021 9381 Gross per 24 hour  Intake 2410 ml  Output 2359 ml  Net 51 ml      Recent Labs    05/31/21 0157  WBC 14.4*  HGB 10.9*  HCT 32.2*  PLT 215    Blood type: --/--/A POS (09/29 0157)  Rubella:   Immune  Physical Exam:  General: alert and cooperative CV: Regular rate and rhythm Resp: clear Abdomen: soft, nontender, normal bowel sounds Incision: clean, dry, and intact Uterine Fundus: firm, below umbilicus, nontender Lochia: minimal Ext:  extremities normal, atraumatic, no cyanosis or edema and no edema, redness or tenderness in the calves or thighs  Assessment/Plan: 40 y.o.   POD# 0. [id number]                  Principal Problem:   Postpartum care following cesarean delivery 9/29  Encourage to ambulate Routine post-op care Active Problems:   Cesarean delivery - 1LTCS twins preterm labor / B transverse   Blood loss anemia  Labs stable  Asymptomatic of anemia  Bleeding minimal  Continue to closely monitor bleeding   Preterm premature rupture of membranes (PPROM) with unknown onset of labor   Preterm delivery  Clancy Gourd, MSN 05/31/2021, 12:10 PM

## 2021-05-31 NOTE — Transfer of Care (Signed)
Immediate Anesthesia Transfer of Care Note  Patient: Penny Horne  Procedure(s) Performed: CESAREAN SECTION  Patient Location: PACU  Anesthesia Type:Spinal  Level of Consciousness: awake, alert  and patient cooperative  Airway & Oxygen Therapy: Patient Spontanous Breathing  Post-op Assessment: Report given to RN and Post -op Vital signs reviewed and stable  Post vital signs: Reviewed and stable  Last Vitals:  Vitals Value Taken Time  BP 120/64 05/31/21 0426  Temp    Pulse 90 05/31/21 0429  Resp 21 05/31/21 0429  SpO2 95 % 05/31/21 0429  Vitals shown include unvalidated device data.  Last Pain:  Vitals:   05/31/21 0023  TempSrc: Oral  PainSc:          Complications: No notable events documented.

## 2021-05-31 NOTE — Anesthesia Preprocedure Evaluation (Signed)
Anesthesia Evaluation  Patient identified by MRN, date of birth, ID band Patient awake    Reviewed: Allergy & Precautions, NPO status , Patient's Chart, lab work & pertinent test results  Airway Mallampati: II  TM Distance: >3 FB Neck ROM: Full    Dental no notable dental hx.    Pulmonary former smoker,  Quit smoking 2017   Pulmonary exam normal breath sounds clear to auscultation       Cardiovascular negative cardio ROS Normal cardiovascular exam Rhythm:Regular Rate:Normal     Neuro/Psych negative neurological ROS  negative psych ROS   GI/Hepatic negative GI ROS, Neg liver ROS,   Endo/Other  Morbid obesityBMI 45  Renal/GU negative Renal ROS  negative genitourinary   Musculoskeletal negative musculoskeletal ROS (+)   Abdominal (+) + obese,   Peds negative pediatric ROS (+)  Hematology negative hematology ROS (+) hct 36.6, plt 235   Anesthesia Other Findings   Reproductive/Obstetrics (+) Pregnancy Twin gestation, PPROM 72 weeks                            Anesthesia Physical Anesthesia Plan  ASA: 3 and emergent  Anesthesia Plan: Spinal   Post-op Pain Management:    Induction:   PONV Risk Score and Plan: 3 and Ondansetron, Dexamethasone and Treatment may vary due to age or medical condition  Airway Management Planned: Natural Airway and Nasal Cannula  Additional Equipment: None  Intra-op Plan:   Post-operative Plan:   Informed Consent: I have reviewed the patients History and Physical, chart, labs and discussed the procedure including the risks, benefits and alternatives for the proposed anesthesia with the patient or authorized representative who has indicated his/her understanding and acceptance.       Plan Discussed with: CRNA  Anesthesia Plan Comments: (Pt not appropriately NPO but in active preterm labor with breech twins, d/w Dr. Juliene Pina- decision to proceed as  emergent case)        Anesthesia Quick Evaluation

## 2021-05-31 NOTE — Lactation Note (Signed)
This note was copied from a baby's chart. Lactation Consultation Note  Patient Name: Penny Horne [id number] Date: 05/31/2021 Reason for consult: Follow-up assessment;NICU baby;Preterm <34wks;Multiple gestation Age:40 hours  Lactation conducted initial consult. I assisted with helping Ms. Laroche pump using her DEBP on the initiate setting. I provided initial pumping education. I recommended that she pump every 2-3 hours during the day and every 3-4 hours at night.   Maternal Data Does the patient have breastfeeding experience prior to this delivery?: Yes How long did the patient breastfeed?: 4 months   Lactation Tools Discussed/Used Tools: Pump Breast pump type: Double-Electric Breast Pump Pump Education: Setup, frequency, and cleaning Reason for Pumping: nicu Pumping frequency: rec q 3  Interventions Interventions: Breast feeding basics reviewed;DEBP;Education  Discharge Pump: DEBP;Personal  Consult Status Consult Status: Follow-up Date: 05/31/21 Follow-up type: In-patient    Walker Shadow 05/31/2021, 3:54 PM

## 2021-05-31 NOTE — Op Note (Signed)
Cesarean Section Procedure Note   Penny Horne  05/31/2021  Indications: PPROM x 10 days twin A, preterm labor, 26.1 weeks, IVF pregnancy, Twin B Breech Presentation  Pre-operative Diagnosis: primary cesarean section for preterm labor at 26.1 weeks Dichorionic Diamniotic IVF twins Twin B breech.   Post-operative Diagnosis: Same                                              Placenta Accreta   Surgeon: Shea Evans, MD    Assistants: Arlan Organ, CNM   Anesthesia: spinal   Procedure Details:  The patient was seen in her Antenatal Room where she has been admitted since 10 days for PPROM. She reported painful contractions and was noted to be in active preterm labor. She received 6 gm Magnesium sulfate bolus prior to moving her up to C-section suite.  The risks, benefits, complications, treatment options, and expected outcomes were discussed with the patient. The patient concurred with the proposed plan, giving informed consent. identified as Penny Horne and the procedure verified as C-Section Delivery.  In OR a Time Out was held and the above information confirmed. 3 gm Ancef given per BMI. After induction of anesthesia, the patient was draped and prepped in the usual sterile manner, foley was draining urine well.  A pfannenstiel incision was made and carried down through the subcutaneous tissue to the fascia. Fascial incision was made and extended transversely. The fascia was separated from the underlying rectus tissue superiorly and inferiorly. The peritoneum was identified and entered. Peritoneal incision was extended longitudinally. Alexis-O retractor placed. Lower segment had increased vascularity and bladder reflection was assessed but not incised. A direct low transverse hysterotomy was performed. Placental edge was encountered, incision was extended curving up by blunt stretching. No amniotic fluid noted. Twin A was reached from below the placental edge, he was low in pelvis and  delivered from cephalic presentation and handed to [facility] for delayed cord clamping. Twin B was high up in the fundus in transverse lie. Amniotomy noted clear fluid.  Twin B boy was delivered by complete Breech extraction keeping the back anterior and head flexed. He too had delayed cord clamping with NICU team in attendance. Cords were clamped and cut at 1 minute and twin Boys were handed off to NICU team. Apgars pending. Cord ph were  not sent. Cord blood was obtained for evaluation.  The placentas were densely adherent with no plain of separation for either placentas- placenta A on right lateral lower wall and placenta B fundal. Piecemeal removal done then I used Banjo curette to gently curette out remaining placenta segments. Methergine and TXA given to control bleeding.  The uterine outline and ovaries appeared normal. The uterine incision was closed with running locked sutures of . A second imbricating layer sutured. 3 other figure of 8 stitches placed on hysterotomy closure due to bleeding from lower segment sinuses.  Hemostasis was observed. Alexis retractor removed. Peritoneal closure done with 2-0 Vicryl.  The fascia was then reapproximated with running sutures of 0Vicryl. The subcuticular closure was performed using 2-0plain gut. The skin was closed with 4-0Vicryl. Steristrips, honeycomb and pressure dressing placed.  Instrument, sponge, and needle counts were correct prior the abdominal closure and were correct at the conclusion of the case.    Findings: Vascular lower segment, right lateral low placenta, edge at hysterotomy, baby  A delivered cephalic from below the placenta edge. Twin B - transverse lie, delivered by complete breech extraction. Twin B placenta fundal with minimal plain of separation consistent with placenta accreta, Placenta A also accreta. Manual removal of most of the placentas done followed by curetting with Banjo curette.    Estimated Blood Loss: 1200 ml (1171 ml  per reading)  Total IV Fluids: 2700 ml LR. TXA 1000 mg.   Urine Output:  200CC OF clear urine  Specimens: cord blood x 2, placenta x 2 with several pieces.   Complications: no complications  Disposition: PACU - hemodynamically stable.   Maternal Condition: stable   Baby condition / location:  NICU  Attending Attestation: I performed the procedure.   Signed: Surgeon(s): Shea Evans, MD

## 2021-06-01 LAB — SURGICAL PATHOLOGY

## 2021-06-01 LAB — CBC
HCT: 25.8 % — ABNORMAL LOW (ref 36.0–46.0)
Hemoglobin: 8.8 g/dL — ABNORMAL LOW (ref 12.0–15.0)
MCH: 31.4 pg (ref 26.0–34.0)
MCHC: 34.1 g/dL (ref 30.0–36.0)
MCV: 92.1 fL (ref 80.0–100.0)
Platelets: 179 10*3/uL (ref 150–400)
RBC: 2.8 MIL/uL — ABNORMAL LOW (ref 3.87–5.11)
RDW: 14.3 % (ref 11.5–15.5)
WBC: 14 10*3/uL — ABNORMAL HIGH (ref 4.0–10.5)
nRBC: 0 % (ref 0.0–0.2)

## 2021-06-01 MED ORDER — SODIUM CHLORIDE 0.9 % IV SOLN
500.0000 mg | Freq: Once | INTRAVENOUS | Status: AC
  Administered 2021-06-01: 500 mg via INTRAVENOUS
  Filled 2021-06-01: qty 25

## 2021-06-01 NOTE — Progress Notes (Signed)
No c/o; feels fine and wants to go home; no sob/cp; tol po; ambulating; +flatus and had bm Says babies doing great Nml lochia; pain controlled; no lightheadedness/dizziness  Patient Vitals for the past 24 hrs:  BP Temp Temp src Pulse Resp SpO2  06/01/21 0756 115/61 97.9 F (36.6 C) Oral 76 18 100 %  06/01/21 0755 -- -- -- -- -- 100 %  06/01/21 0500 106/62 98 F (36.7 C) Oral 60 18 100 %  05/31/21 2352 (!) 93/56 97.9 F (36.6 C) Oral 82 20 98 %  05/31/21 1936 96/66 98.9 F (37.2 C) Oral 79 18 99 %  05/31/21 1611 -- 98.1 F (36.7 C) Oral -- -- --  05/31/21 1507 (!) 108/51 -- -- 74 18 --  05/31/21 1410 -- -- -- -- -- 98 %  05/31/21 1408 105/81 98 F (36.7 C) Oral (!) 116 19 98 %   A&ox3 Rrr Ctab Abd: +bs, soft, nt, nd; obese, fundus firm and below umb; dressing intact LE: tr edema, nt bilat  CBC Latest Ref Rng & Units 06/01/2021 05/31/2021 05/31/2021  WBC 4.0 - 10.5 K/uL 14.0(H) 16.8(H) 14.4(H)  Hemoglobin 12.0 - 15.0 g/dL 6.0(A) 5.4(U) 10.9(L)  Hematocrit 36.0 - 46.0 % 25.8(L) 29.4(L) 32.2(L)  Platelets 150 - 400 K/uL 179 214 215   A/P: pod1 s/p ltcs (pprom, di/di twins) Doing well - contin care; consider d/c home tomorrow Chronic iron deficiency anemia with acute blood loss - iv iron now, then oral iron Placenta acreta x2 - nml lochia and contin to monitor closely Failed 1 hr gtt - f/u pp Obesity - encourage ambulation  Babies in nicu

## 2021-06-02 MED ORDER — POLYSACCHARIDE IRON COMPLEX 150 MG PO CAPS
150.0000 mg | ORAL_CAPSULE | Freq: Every day | ORAL | Status: DC
  Administered 2021-06-03: 150 mg via ORAL
  Filled 2021-06-02: qty 1

## 2021-06-02 NOTE — Plan of Care (Signed)
Good pain control on po medication per patient. Has scant vaginal bleeding and has had a bowel movement. DTR's are normal and vital signs are WNL.

## 2021-06-02 NOTE — Progress Notes (Signed)
Subjective: Postpartum Day 2: Cesarean Delivery at 26.2 DCDA twin pregnancy c/b PPROM/PTL  Patient is doing better today. States she had more incisional pain and soreness yesterday but improving with pain medications. Up and walking without dizziness. Denies any CP, SOB, or HA. States VB has been very light. Voiding without difficulties and had BM yesterday. Tolerating regular diet. Pumping going well.  Baby twin boys improving in the NICU. Patient tearful while discussing Baby A needing additional interventions yesterday but just got off phone with NICU RN reporting improvements this morning. Feels well supported with family and friends. Husband currently home with their 3YO.  Objective: Patient Vitals for the past 24 hrs:  BP Temp Temp src Pulse Resp SpO2  06/02/21 0833 (!) 94/50 98 F (36.7 C) Oral 76 16 100 %  06/02/21 0614 (!) 111/53 97.8 F (36.6 C) Oral 76 15 100 %  06/01/21 2300 (!) 119/58 98 F (36.7 C) Oral 81 18 98 %  06/01/21 1950 (!) 115/53 98.1 F (36.7 C) Oral (!) 127 18 100 %  06/01/21 1537 114/72 97.9 F (36.6 C) Oral 96 18 100 %  06/01/21 1151 110/64 98.2 F (36.8 C) Oral 91 18 100 %   No intake or output data in the 24 hours ending 06/02/21 1043   Physical Exam:  General: alert and no distress Lochia: appropriate Uterine Fundus: firm Incision: Honeycomb dressing in place, old drainage noted on right aspect, no active drainage or separation appreciated DVT Evaluation: No evidence of DVT seen on physical exam.  Recent Labs    05/31/21 1515 06/01/21 0456  HGB 9.9* 8.8*  HCT 29.4* 25.8*    Assessment/Plan: Status post Cesarean section. Doing well postoperatively.  Continue current care. NANSI [place] [id number] POD#2 sp primary c/s at [redacted]w[redacted]d for DCDA twins PPROM/PTL 1. PPC: continue current pain regimen per ERAS protocol, regular diet, encourage ambulation 2. Acute blood loss on chronic iron deficient anemia, s/p IV Venofer x1. PO Iron started today 3.  Obesity encourage ambulation for DVT ppx 4. RH POS/Rubella Imm 5. Preterm boys in NICU stable but critical condition, emotional support provided 6. Lactation support PRN  7. Anticipate DC home tomorrow  Sharonann Malbrough A Monet North 06/02/2021, 10:40 AM

## 2021-06-02 NOTE — Lactation Note (Signed)
This note was copied from a baby's chart. Lactation Consultation Note Mother continues to pump frequently and without difficulty. She has a Spectra pump to use at home. She is aware of importance of frequent pumping. She reports drops of colostrum; normal for 59 hours p delivery.   Patient Name: Penny Horne [id number] Date: 06/02/2021 Reason for consult: Follow-up assessment Age:40 hours  Maternal Data  Pumping frequency: q 2-3 hours  Feeding Mother's Current Feeding Choice: Breast Milk  Interventions Interventions: Education  Consult Status Consult Status: Follow-up Date: 06/02/21 Follow-up type: In-patient   [name] 06/02/2021, 2:35 PM

## 2021-06-03 DIAGNOSIS — G8918 Other acute postprocedural pain: Secondary | ICD-10-CM | POA: Insufficient documentation

## 2021-06-03 MED ORDER — OXYCODONE HCL 5 MG PO TABS: 5.0000 mg | ORAL_TABLET | ORAL | 0 refills | Status: DC | PRN

## 2021-06-03 MED ORDER — IBUPROFEN 600 MG PO TABS: 600.0000 mg | ORAL_TABLET | Freq: Four times a day (QID) | ORAL | 0 refills | Status: DC | PRN

## 2021-06-03 NOTE — Discharge Summary (Signed)
Postpartum Discharge Summary  Date of Service updated 06/03/21     Patient Name: Penny Horne DOB: August 26, 1981 MRN: [id number]  Date of admission: 05/20/2021 Delivery date:   Penny, Horne [[id number]]  05/31/2021    [name], Penny Horne [[id number]]  05/31/2021  Delivering provider:    Christiana Fuchs Horne [[id number]]  MODY, Penny, Mapp Horne [[id number]]  MODY, Penny Horne  Date of discharge: 06/03/2021  Admitting diagnosis: Preterm premature rupture of membranes (PPROM) with unknown onset of labor [O42.919] Preterm delivery [O60.10X0] Intrauterine pregnancy: [redacted]w[redacted]d    Secondary diagnosis:  Principal Problem:   Postpartum care following cesarean delivery 9/29 Active Problems:   Cesarean delivery - 1LTCS twins preterm labor / B transverse   Blood loss anemia   Preterm premature rupture of membranes (PPROM) with unknown onset of labor   Preterm delivery  Additional problems: n/a    Discharge diagnosis: Preterm Pregnancy Delivered and Anemia                                              Post partum procedures: IV Venofer Augmentation: N/A Complications: [id number] Hospital course: Onset of Labor With Unplanned C/S   40y.o. yo [id number] 241w2das admitted with PPROM on 05/20/2021. Patient had latency antibiotics, betamethasone steroid course, and fetal surveillance. She remained stable until 05/30/21 when she went into labor with regular contractions and cervical change. The patient went for cesarean section due to Malpresentation of Baby B with preterm status at 26.2 Delivery details as follows: Membrane Rupture Time/Date:    Penny Horne [0[[id number]]10:30 PM    Penny Horne [0[[id number]]3:19 AM ,   Penny, Horne [[id number]]05/20/2021    Penny, Horne [0[[id number]]05/31/2021   Delivery Method:   Penny Horne [0[[id number]]C-Section, Low Transverse    Penny Horne [0[[id number]]C-Section, Low  Transverse  Details of operation can be found in separate operative note. Patient had an uncomplicated postpartum course. She received IV Iron therapy on POD1. Both babies in NICU with Baby A in critical condition. Patient appropriately emotional, but denies any feelings of depression, has excellent social support, and declines antidepressant medication. She is meeting postoperative milestones with ambulating,tolerating a regular diet, passing flatus, and urinating well.  Patient is discharged home in stable condition 06/03/21.  Newborn Data: Birth date:   Penny, [name][[id number]]05/31/2021    Horne, [name]0[[id number]]05/31/2021  Birth time:   Penny, Horne [0[[id number]]3:19 AM    Penny Horne [0[[id number]]3:20 AM  Gender:   Penny, Horne [0[[id number]]Female    [place], [name][[id number]]Female  Living status:   Penny, [name]0[[id number]]  EBRAXE    NMMHW, Penny Horne [0[[id number]]Living  Apgars:   Penny, Horne [0[[id number]]7 [name] [0[[id number]]7 [place]   Penny, [place]0[[id number]]9    Penny, Horne [0[[id number]]9  Weight:   Penny, Horne [0[[id number]]  9774    Penny, Horne [0[[id number]]  2334   Magnesium Sulfate received: Yes: Neuroprotection BMZ received: Yes Rhophylac:N/A MMR:N/A T-DaP:Given prenatally  Physical exam  Vitals:   06/02/21 1636 06/02/21 1916 06/03/21 0048 06/03/21  0615  BP: (!) 116/48 124/72 110/61 (!) 104/53  Pulse: 84 84 96 72  Resp: _0 Temp: 98.4 F (36.9 C) 97.6 F (36.4 C) 98.2 F (36.8 C) 98.7 F (37.1 C)  TempSrc: Oral Oral Oral Oral  SpO2: 99% 99% 100% 100%  Weight:      Height:       General: alert and no distress Lochia: appropriate Uterine Fundus: firm Incision: Healing well with no significant drainage DVT Evaluation: No evidence of DVT seen on physical exam. Labs: Lab Results  Component Value Date   WBC 14.0  (H) 06/01/2021   HGB 8.8 (L) 06/01/2021   HCT 25.8 (L) 06/01/2021   MCV 92.1 06/01/2021   PLT 179 06/01/2021   CMP Latest Ref Rng & Units 05/21/2021  Glucose 70 - 99 mg/dL 95  BUN 6 - 20 mg/dL 6  Creatinine 0.44 - 1.00 mg/dL 0.60  Sodium 135 - 145 mmol/L 133(L)  Potassium 3.5 - 5.1 mmol/L 4.2  Chloride 98 - 111 mmol/L 103  CO2 22 - 32 mmol/L 19(L)  Calcium 8.9 - 10.3 mg/dL 9.8  Total Protein 6.5 - 8.1 g/dL 6.6  Total Bilirubin 0.3 - 1.2 mg/dL 0.8  Alkaline Phos 38 - 126 U/L 65  AST 15 - 41 U/L 36  ALT 0 - 44 U/L 25   Edinburgh Score: Edinburgh Postnatal Depression Scale Screening Tool 03/18/2018  I have been able to laugh and see the funny side of things. 0  I have looked forward with enjoyment to things. 0  I have blamed myself unnecessarily when things went wrong. 0  I have been anxious or worried for no good reason. 0  I have felt scared or panicky for no good reason. 0  Things have been getting on top of me. 0  I have been so unhappy that I have had difficulty sleeping. 0  I have felt sad or miserable. 1  I have been so unhappy that I have been crying. 0  The thought of harming myself has occurred to me. 0  Edinburgh Postnatal Depression Scale Total 1      After visit meds:  Allergies as of 06/03/2021   No Known Allergies      Medication List     TAKE these medications    acetaminophen 325 MG tablet Commonly known as: Tylenol Take 2 tablets (650 mg total) by mouth every 4 (four) hours as needed (for pain scale < 4).   ibuprofen 600 MG tablet Commonly known as: ADVIL Take 1 tablet (600 mg total) by mouth every 6 (six) hours as needed for mild pain or moderate pain. What changed:  when to take this reasons to take this   IRON 27 PO Take 27 mg by mouth daily.   oxyCODONE 5 MG immediate release tablet Commonly known as: Oxy IR/ROXICODONE Take 1-2 tablets (5-10 mg total) by mouth every 4 (four) hours as needed for moderate pain.   prenatal multivitamin  Tabs tablet Take 1 tablet by mouth daily at 12 noon.               Discharge Care Instructions  (From admission, onward)           Start     Ordered   06/03/21 0000  Discharge wound care:       Comments: Sitz baths 2 times /day with warm water x 1 week. May add herbals: 1 ounce dried comfrey leaf* 1 ounce calendula flowers 1 ounce  lavender flowers  Supplies can be found online at [employer] sources at [employer], [employer]  1/2 ounce dried uva ursi leaves 1/2 ounce witch hazel blossoms (if you can find them) 1/2 ounce dried sage leaf 1/2 cup sea salt Directions: Bring 2 quarts of water to a boil. Turn off heat, and place 1 ounce (approximately 1 large handful) of the above mixed herbs (not the salt) into the pot. Steep, covered, for 30 minutes.  Strain the liquid well with a fine mesh strainer, and discard the herb material. Add 2 quarts of liquid to the tub, along with the 1/2 cup of salt. This medicinal liquid can also be made into compresses and peri-rinses.   06/03/21 1058             Discharge home in stable condition Infant Feeding:  NICU. Patient is pumping and collecting breast milk. Infant Disposition:NICU Discharge instruction: per After Visit Summary and Postpartum booklet. Activity: Advance as tolerated. Pelvic rest for 6 weeks.  Diet: routine diet Anticipated Birth Control: Unsure Postpartum Appointment:6 weeks Additional Postpartum F/U:  N/A Future Appointments:No future appointments. Follow up Visit: [facility] Dr. Benjie Karvonen within 6 weeks for postpartum check.     06/03/2021 Misa Fedorko A Bennett Ram, DO

## 2021-06-04 ENCOUNTER — Telehealth: Payer: Self-pay

## 2021-06-04 NOTE — Telephone Encounter (Signed)
Transition Care Management Unsuccessful Follow-up Telephone Call  Date of discharge and from where:  06/03/2021-[facility]    Attempts:  1st Attempt  Reason for unsuccessful TCM follow-up call:  Left voice message

## 2021-06-05 NOTE — Telephone Encounter (Signed)
Transition Care Management Unsuccessful Follow-up Telephone Call  Date of discharge and from where:  06/03/2021 from [facility]  Attempts:  2nd Attempt  Reason for unsuccessful TCM follow-up call:  Left voice message

## 2021-06-06 NOTE — Telephone Encounter (Signed)
Transition Care Management Unsuccessful Follow-up Telephone Call  Date of discharge and from where:  06/03/2021 from [facility]  Attempts:  3rd Attempt  Reason for unsuccessful TCM follow-up call:  Unable to reach patient   Spoke to patient spouse and he stated that patient was taking a nap and doing well.

## 2021-06-09 ENCOUNTER — Ambulatory Visit: Payer: Self-pay

## 2021-06-09 NOTE — Lactation Note (Signed)
This note was copied from a baby's chart. Lactation Consultation Note  Patient Name: Penny Horne [id number] Date: 06/09/2021 Reason for consult: Multiple gestation;NICU baby;Weekly NICU follow-up;Preterm <34wks;Infant < 6lbs Age:40 days  Visited with mom of 65 6/30 weeks old (adjusted) NICU female, baby "B" "[name]". Baby "A" "[name]" passed away on Jun 24, 2021, expressed mom my condolences. She repots she's feeling better now and was in good spirits at the time of consult.  She reports that she's been pumping but not consistently due to all the challenges she had to face during the week. Expressed my sympathy to mom and reminded her of the array of resources that we have available if she needs to talk to someone.   Mom understands the importance of consistent pumping and she said she'll try to do a better job now that she'll be settling into a routine. Reviewed pumping schedule, lactogenesis III and benefits of breastmilk for NICU babies.  Plan of care:  Encouraged mom to start pumping consistently every 3 hours Hand expression, breast massage and coconut oil were also encouraged prior pumping  BF brochure was reviewed. No other support person at this time. All questions and concerns answered, mom to call NICU LC PRN.  Maternal Data   Mom's supply is abundant and ANL probably due to twin pregnancy  Feeding Mother's Current Feeding Choice: Breast Milk  Lactation Tools Discussed/Used Tools: Pump Breast pump type: Double-Electric Breast Pump Pump Education: Setup, frequency, and cleaning;Milk Storage Reason for Pumping: pre-term NICU infant Pumping frequency: 4-7 times/24 hours Pumped volume: 195 mL  Interventions Interventions: DEBP;Education  Discharge Pump: DEBP;Personal (Spectra DEBP at home)  Consult Status Consult Status: Follow-up Date: 06/09/21 Follow-up type: In-patient   Jenetta Wease Venetia Constable 06/09/2021, 4:32 PM

## 2021-06-12 ENCOUNTER — Telehealth (HOSPITAL_COMMUNITY): Payer: Self-pay | Admitting: *Deleted

## 2021-06-12 NOTE — Telephone Encounter (Signed)
Patient voiced no questions or concerns regarding her own health at this time. EPDS = 8. RN discussed patient's emotions with the loss of Baby A and Baby B still in NICU. Patient stated, "I have a large tribe to support me." RN offered email information on hospital's virtual bereavement support group and on maternal mental health resources. Patient agreed. Deforest Hoyles, RN, 06/12/21, 1624.

## 2021-06-12 NOTE — Telephone Encounter (Signed)
Attempted hospital discharge follow-up call. Left message for patient to return RN call. Deforest Hoyles, RN, 06/12/21, 1600.

## 2021-06-14 ENCOUNTER — Ambulatory Visit: Payer: Self-pay

## 2021-06-14 NOTE — Lactation Note (Signed)
This note was copied from a baby's chart. Lactation Consultation Note  Patient Name: Penny Horne ZOXWR'U Date: 06/14/2021 Reason for consult: L&D Initial assessment;Mother's request;NICU baby Age:39 wk.o.  Ms. Linders requested lactation to room to answer questions about the safety of taking 10 mg oxycodone for pain. She states that she last took it last night, but she wants to stop taking it due to some concerns she read online about it with breastmilk. Mom states that she is going to switch to ibuprofen. We also discussed her milk production. Her volume has decreased this week. We discussed the importance of pumping frequency and increasing from 4 pumps a day to 8, when possible. We also discussed lactogenic foods and I referred her to [email or web address] as a website that contains evidence based lactation information.   Maternal Data Does the patient have breastfeeding experience prior to this delivery?: Yes How long did the patient breastfeed?: 4 months  Feeding Mother's Current Feeding Choice: Breast Milk   Lactation Tools Discussed/Used Breast pump type: Double-Electric Breast Pump Pump Education: Setup, frequency, and cleaning Reason for Pumping: NICU - preterm Pumping frequency: 4 times a day Pumped volume: 60 mL  Interventions Interventions: Breast feeding basics reviewed;Education  Discharge    Consult Status Consult Status: Follow-up Date: 06/14/21 Follow-up type: In-patient    Walker Shadow 06/14/2021, 4:13 PM

## 2021-06-18 ENCOUNTER — Ambulatory Visit: Payer: Self-pay

## 2021-06-18 NOTE — Lactation Note (Signed)
This note was copied from a baby's chart.  NICU Lactation Consultation Note  Patient Name: Penny Horne [id number] Date: 06/18/2021 Age:40 wk.o.   Subjective Reason for consult: Weekly NICU follow-up Mother is concerned about low milk volume and has difficulty finding time to pump regularly. She is using some over the counter herbs to increase supply.  Objective Infant data: Mother's Current Feeding Choice: Breast Milk   Maternal data: [id number]  C-Section, Low Transverse  Pumping frequency: 4-5 x day Pumped volume: 45 mL   Assessment Infant:  Maternal: Mother's milk supply will likely improve with increasing pumping   Intervention/Plan Interventions: Education  No data recorded Plan: Consult Status: Follow-up  NICU Follow-up type: Weekly NICU follow up  Mother to increase pumping to q3 with a 5-hour break to sleep  [name] 06/18/2021, 1:40 PM

## 2021-06-19 DIAGNOSIS — R69 Illness, unspecified: Secondary | ICD-10-CM | POA: Diagnosis not present

## 2021-06-20 DIAGNOSIS — R69 Illness, unspecified: Secondary | ICD-10-CM | POA: Diagnosis not present

## 2021-06-21 DIAGNOSIS — R69 Illness, unspecified: Secondary | ICD-10-CM | POA: Diagnosis not present

## 2021-06-22 DIAGNOSIS — R69 Illness, unspecified: Secondary | ICD-10-CM | POA: Diagnosis not present

## 2021-06-23 DIAGNOSIS — R69 Illness, unspecified: Secondary | ICD-10-CM | POA: Diagnosis not present

## 2021-07-03 DIAGNOSIS — Z419 Encounter for procedure for purposes other than remedying health state, unspecified: Secondary | ICD-10-CM | POA: Diagnosis not present

## 2021-07-06 ENCOUNTER — Ambulatory Visit: Payer: Self-pay

## 2021-07-06 NOTE — Lactation Note (Signed)
This note was copied from a baby's chart.  NICU Lactation Consultation Note  Patient Name: Penny Horne ZOXWR'U Date: 07/06/2021 Age:40 years old   Subjective Reason for consult: Follow-up assessment Mother is pleased that her milk volume has increased. She endorses over the counter use of herbal galactagogues. We discussed lick and learn bf'ing when baby is ready.    Objective Infant data: Mother's Current Feeding Choice: Breast Milk  Infant feeding assessment Scale for Readiness: 3     Maternal data: [id number]  C-Section, Low Transverse  Pumping frequency: q 2-3 during the day and 1 5-hour break at night Pumped volume: 120 mL  Assessment Maternal: Milk volume: Normal  Intervention/Plan Interventions: Education; Infant Driven Feeding Algorithm education  Plan: Consult Status: Follow-up  NICU Follow-up type: Weekly NICU follow up    Penny Horne 07/06/2021, 12:37 PM

## 2021-07-21 ENCOUNTER — Ambulatory Visit: Payer: Self-pay

## 2021-07-21 NOTE — Lactation Note (Signed)
This note was copied from a baby's chart.  NICU Lactation Consultation Note  Patient Name: Penny Horne [id number] Date: 07/21/2021 Age:40 wk.o.   Subjective Reason for consult: Weekly NICU follow-up Mother continues to pump frequently. She would like to begin bf'ing when [name] is ready.   Objective Infant data: Mother's Current Feeding Choice: Breast Milk  Infant feeding assessment Scale for Readiness: 3 Scale for Quality: 5     Maternal data: [id number]  C-Section, Low Transverse  Pumping frequency: 60-157mL 7xday  Assessment Maternal: Milk volume: Normal   Intervention/Plan Interventions: Infant Driven Feeding Algorithm education; Education  Plan: Consult Status: Follow-up  NICU Follow-up type: Weekly NICU follow up  Mother will continue pumping q3   [name] 07/21/2021, 3:20 PM

## 2021-07-28 ENCOUNTER — Ambulatory Visit: Payer: Self-pay

## 2021-07-28 NOTE — Lactation Note (Signed)
This note was copied from a baby's chart. Lactation Consultation Note  Patient Name: Penny Horne [id number] Date: 07/28/2021 Reason for consult: Follow-up assessment;NICU baby;Infant < 6lbs;Late-preterm 34-36.6wks Age:40 wk.o.  LC in baby's room to visit with mom but she wasn't present at a time, asked NICU RN Maralyn Sago to page [facility] once mom arrives. Once she did, she said mom didn't want to see lactation for the rest of her stay unless she's the one calling out.  Per NICU RN mom might have felt pressured to BF, but there wasn't even an attempt or a LATCH score in the chart, baby just turned 34 weeks four days ago. Mom aware of NICU LC services and will call PRN.  Feeding Mother's Current Feeding Choice: Breast Milk  Consult Status Consult Status: PRN Date: 07/28/21 Follow-up type: In-patient   Talesha Ellithorpe Venetia Constable 07/28/2021, 5:06 PM

## 2021-08-02 DIAGNOSIS — Z419 Encounter for procedure for purposes other than remedying health state, unspecified: Secondary | ICD-10-CM | POA: Diagnosis not present

## 2021-08-05 ENCOUNTER — Ambulatory Visit: Payer: Self-pay

## 2021-08-05 NOTE — Lactation Note (Addendum)
This note was copied from a baby's chart. Lactation Consultation Note  Patient Name: Ephrata Verville WUJWJ'[id number] Date: 08/05/2021 Reason for consult: Follow-up assessment;NICU baby;Late-preterm 34-36.6wks;Multiple gestation;Mother's request;Other (Comment) (IVF, AMA) Age:40 m.o.  Visited with mom of 40 62/57 weeks old (adjusted) NICU female, she requested to see lactation because she had a question regarding some medications.   Mom left a message on the NICU LC line regarding Midol, looked it up and found out the only type of Midol she can take is the extended release type (L3) since all the other ones have bromide which is not recommended during breastfeeding according to "Medications and Mother's milk" 2023 edition.   Once mom came to the hospital, [facility] visited in baby's room, she had another question regarding a different medication (supplement) XeTRM. Looked up the ingredient list and found out it also contained bromide. Advised mom to find an alternative supplement without this active ingredient. She's also aware she should check with her provider before taking any medicine/supplement while breastfeeding.  Mom reports pumping is going well even though she came back to work 3 weeks ago, but she's been trying to work remote as much as she can. She also said she's been taking baby to breast for the last 4 days and he's been going great, mom showed LC a video she took of baby latching on a semi-pumped breast, baby able to sustain the latch with multiple audible swallows noted. NICU RN Onalee Hua was baby's RN that day; praised mom for her efforts.  Reviewed pumping schedule, power pumping and expectations once ovulation cycle starts again, mom has had  2 periods already and voiced a decrease in her supply.   Maternal Data  Mom's supply stays slightly BNL, but she's doing her best and is OK with supplementing with formula since most of baby's intake is her breastmilk  Feeding Mother's Current Feeding Choice:  Breast Milk and Formula  Lactation Tools Discussed/Used Tools: Pump Breast pump type: Double-Electric Breast Pump Pump Education: Setup, frequency, and cleaning;Milk Storage Reason for Pumping: LPI in NICU Pumping frequency: 7-8 times/24 hours Pumped volume: 60 mL (60-120 ml)  Interventions Interventions: Breast feeding basics reviewed;DEBP;Education  Plan of care   Encouraged mom to continue pumping consistently every 3 hours She'll call NICU LC for latch assistance whenever she's ready. Mom feels comfortable enough to take baby to breast; but she understands that she can call for assistance anytime   No other support person at this time. All questions and concerns answered, mom to call NICU LC PRN.  Discharge Pump: DEBP;Personal (Spectra)  Consult Status Consult Status: PRN Date: 08/05/21 Follow-up type: In-patient   Arantxa Piercey Venetia Constable 08/05/2021, 1:39 PM

## 2021-08-08 ENCOUNTER — Ambulatory Visit: Payer: Self-pay

## 2021-08-08 NOTE — Lactation Note (Signed)
This note was copied from a baby's chart. Lactation Consultation Note  Patient Name: Penny Horne [id number] Date: 08/08/2021   Age:40 m.o.  SLP Irving Burton spoke to mom and she now wishes to see lactation again. PRN status changed to F/U. SLP will contact NICU LC the next time baby is ready for a feeding assist.   Hodge Stachnik S Minervia Osso 08/08/2021, 1:11 PM

## 2021-08-09 ENCOUNTER — Ambulatory Visit: Payer: Self-pay

## 2021-08-09 NOTE — Lactation Note (Signed)
This note was copied from a baby's chart. Lactation Consultation Note  Patient Name: Penny Horne JOACZ'Y Date: 08/09/2021 Reason for consult: Follow-up assessment;NICU baby;Preterm <34wks Age:40 m.o.  Lactation followed up with Rogelia Rohrer [facility]) who is now IDF2. He had just latched prior to entry. Ms. Galster denies questions or concerns. Ms. Brandle has returned to work, but she is able to work from home this week to concentrate on breast feeding. A follow up by lactation to observe and document a feeding would be helpful.  Maternal Data Has patient been taught Hand Expression?: Yes Does the patient have breastfeeding experience prior to this delivery?: Yes  Feeding Mother's Current Feeding Choice: Breast Milk and Formula   Lactation Tools Discussed/Used Breast pump type: Double-Electric Breast Pump Pump Education: Setup, frequency, and cleaning Reason for Pumping: Support milk production Pumping frequency: rec q3 hours Pumped volume: 90 mL (60-105)  Interventions Interventions: Breast feeding basics reviewed;Education  Discharge Pump: DEBP;Personal  Consult Status Consult Status: Follow-up Date: 08/09/21 Follow-up type: In-patient    Walker Shadow 08/09/2021, 2:40 PM

## 2021-08-16 ENCOUNTER — Ambulatory Visit: Payer: Self-pay

## 2021-08-16 NOTE — Lactation Note (Signed)
This note was copied from a baby's chart. Lactation Consultation Note  Patient Name: Penny Horne WEXHB'Z Date: 08/16/2021 Reason for consult: Follow-up assessment Age:40 m.o.  Penny Horne and son, Penny Horne, were preparing for discharge. I provided our community breast feeding resources and recommended an outpatient appointment. [name] endorsed a referral to OP services. I placed a referral with S. Hice.  Feeding Nipple Type: Dr. Levert Feinstein Preemie   Discharge Discharge Education: Outpatient recommendation;Outpatient Epic message sent  Consult Status Consult Status: Complete Date: 08/16/21 Follow-up type: Out-patient    Walker Shadow 08/16/2021, 4:04 PM

## 2021-09-02 DIAGNOSIS — Z419 Encounter for procedure for purposes other than remedying health state, unspecified: Secondary | ICD-10-CM | POA: Diagnosis not present

## 2021-09-24 ENCOUNTER — Other Ambulatory Visit: Payer: Self-pay

## 2021-09-24 ENCOUNTER — Ambulatory Visit: Payer: Managed Care, Other (non HMO) | Admitting: Podiatry

## 2021-09-24 ENCOUNTER — Ambulatory Visit (INDEPENDENT_AMBULATORY_CARE_PROVIDER_SITE_OTHER): Payer: Managed Care, Other (non HMO)

## 2021-09-24 DIAGNOSIS — M722 Plantar fascial fibromatosis: Secondary | ICD-10-CM | POA: Diagnosis not present

## 2021-09-24 DIAGNOSIS — M2011 Hallux valgus (acquired), right foot: Secondary | ICD-10-CM

## 2021-09-24 MED ORDER — MELOXICAM 15 MG PO TABS: 15.0000 mg | ORAL_TABLET | Freq: Every day | ORAL | 1 refills | Status: DC

## 2021-09-24 MED ORDER — BETAMETHASONE SOD PHOS & ACET 6 (3-3) MG/ML IJ SUSP
3.0000 mg | Freq: Once | INTRAMUSCULAR | Status: AC
  Administered 2021-09-24: 3 mg via INTRA_ARTICULAR

## 2021-09-24 NOTE — Progress Notes (Signed)
Subjective: 41 y.o. female presenting today for follow-up evaluation of a symptomatic bunion and hammertoe to the right foot as well as bilateral heel pain.  Patient was last seen in the office on 09/11/2020 and she was about to proceed with surgery to address her chronic painful bunion and hammertoe to the right foot.  At that time during preoperative evaluation she found out that she was pregnant.  She canceled the surgery and had twins on 05/31/2021.  Unfortunately only one survived.  She is doing well however it is no longer breast-feeding.  Her baby has been home from the hospital now for a few weeks.  Patient states that while she is out of work she would really like to address her bunion and hammertoe deformity again.  It is been very painful and symptomatic this past year.  She is anxious to have it corrected as we planned earlier this year. Patient also has severe heel pain to the bilateral heels.  She says this is been present for about 1 month now.  She has not done anything for treatment at the moment.  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 S/P  SALPINGECTOMY 09-20-2015   Hydrosalpinx    RIGHT   Pregnancy resulting from in vitro fertilization, third trimester 03/17/2018   Shoulder dystocia during labor and delivery, delivered 03/17/2018   Wears glasses    Past Surgical History:  Procedure Laterality Date   CESAREAN SECTION MULTI-GESTATIONAL N/A 05/31/2021   Procedure: CESAREAN SECTION MULTI-GESTATIONAL;  Surgeon: Shea Elaine Middleton, MD;  Location: [facility];  Service: Obstetrics;  Laterality: N/A;   LAPAROSCOPY N/A 09/20/2015   Procedure: LAPAROSCOPY DIAGNOSTIC;  Surgeon: Shea Gertude Benito, MD;  Location: [facility];  Service: Gynecology;  Laterality: N/A;   LAPAROSCOPY N/A 03/25/2017   Procedure: LAPAROSCOPY OPERATIVE;  Surgeon: Fermin Schwab, MD;  Location: [facility];  Service:  Gynecology;  Laterality: N/A;   LYSIS OF ADHESION N/A 03/25/2017   Procedure: LAPAROSCOPY, LYSIS OF ADHESION, BILATERAL SALPINGECTOMY, CHROMOPERTUBATION, ENDOMETRIAL BIOPSY;  Surgeon: Fermin Schwab, MD;  Location: [facility];  Service: Gynecology;  Laterality: N/A;   UNILATERAL SALPINGECTOMY Left 09/20/2015   Procedure: UNILATERAL SALPINGECTOMY;  Surgeon: Shea Kyen Taite, MD;  Location: [facility];  Service: Gynecology;  Laterality: Left;   No Known Allergies   Objective: Physical Exam General: The patient is alert and oriented x3 in no acute distress.  Dermatology: Skin is cool, dry and supple bilateral lower extremities. Negative for open lesions or macerations.  Vascular: Palpable pedal pulses bilaterally. No edema or erythema noted. Capillary refill within normal limits.  Neurological: Epicritic and protective threshold grossly intact bilaterally.   Musculoskeletal Exam: Clinical evidence of bunion deformity noted to the respective foot. There is moderate pain on palpation range of motion of the first MPJ. Lateral deviation of the hallux noted consistent with hallux abductovalgus. Hammertoe contracture also noted on clinical exam to digits #5 of the right foot. Symptomatic pain on palpation and range of motion also noted to the metatarsal phalangeal joints of the respective hammertoe digits.   There is also pain on palpation to the bilateral plantar fascia  Radiographic Exam: Increased intermetatarsal angle greater than 15 with a hallux abductus angle greater than 30 noted on AP view. Moderate degenerative changes noted within the first MPJ. Contracture deformity also noted to the interphalangeal joints and MPJs of the digits of the respective  hammertoes.    Assessment: 1. HAV w/ bunion deformity right 2. Hammertoe deformity fifth digit right 3.  Plantar fasciitis bilateral   Plan of Care:  1. Patient was evaluated. X-Rays reviewed. 2. Today again we discussed the  conservative versus surgical management of the presenting pathology. The patient opts for surgical management. All possible complications and details of the procedure were explained. All patient questions were answered. No guarantees were expressed or implied. 3. Authorization for surgery was initiated today. Surgery will consist of bunionectomy with osteotomy right. PIPJ arthroplasty with derotational skin plasty fifth digit right.  Patient will need medical clearance from her PCP prior to surgery.  Patient states that she has no longer breast-feeding 4.  Injection of 0.5 cc Celestone Soluspan injected into the bilateral plantar fascia 5.  Prescription for meloxicam 15 mg daily 6.  Compression ankle sleeves dispensed.  Wear daily  7.  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 Has a 72-year-old daughter.     Felecia Shelling, DPM [facility]  Dr. Felecia Shelling, DPM    [address] [place], [place] [address]                Office [phone]  Fax [phone]

## 2021-09-26 ENCOUNTER — Encounter: Payer: Self-pay | Admitting: Podiatry

## 2021-10-03 DIAGNOSIS — Z419 Encounter for procedure for purposes other than remedying health state, unspecified: Secondary | ICD-10-CM | POA: Diagnosis not present

## 2021-10-09 ENCOUNTER — Telehealth: Payer: Self-pay | Admitting: Urology

## 2021-10-09 NOTE — Telephone Encounter (Signed)
DOS - 11/01/21  [place] BUNIONECTOMY RIGHT --- [id number] HAMMERTOE REPAIR 5TH RIGHT --- [id number]  CIGNA EFFECTIVE DATE - 03/02/20 [facility] MEDICAID EFFECTIVE DATE - 05/03/21  PER CIGNA'S AUTOMATIVE SYSTEM FOR CPT CODES 07680 AND 28285 NO PRIOR AUTH IS REQUIRED.  REF # [id number] REF # [id number]  RECEIVED FAX FROM [facility] STATING THAT CPT CODES 88110 AND 28285 HAVE BEEN APPROVED, AUTH # [id number], GOOD FROM 11/01/21 - 12/31/21.

## 2021-10-29 ENCOUNTER — Encounter: Payer: Self-pay | Admitting: Podiatry

## 2021-10-31 DIAGNOSIS — Z419 Encounter for procedure for purposes other than remedying health state, unspecified: Secondary | ICD-10-CM | POA: Diagnosis not present

## 2021-11-01 ENCOUNTER — Other Ambulatory Visit: Payer: Self-pay | Admitting: Podiatry

## 2021-11-01 ENCOUNTER — Encounter: Payer: Self-pay | Admitting: Podiatry

## 2021-11-01 DIAGNOSIS — M2011 Hallux valgus (acquired), right foot: Secondary | ICD-10-CM

## 2021-11-01 DIAGNOSIS — M2041 Other hammer toe(s) (acquired), right foot: Secondary | ICD-10-CM

## 2021-11-01 DIAGNOSIS — M79676 Pain in unspecified toe(s): Secondary | ICD-10-CM

## 2021-11-01 MED ORDER — OXYCODONE-ACETAMINOPHEN 5-325 MG PO TABS: 1.0000 | ORAL_TABLET | ORAL | 0 refills | Status: DC | PRN

## 2021-11-01 MED ORDER — IBUPROFEN 800 MG PO TABS: 800.0000 mg | ORAL_TABLET | Freq: Three times a day (TID) | ORAL | 1 refills | Status: DC

## 2021-11-01 NOTE — Progress Notes (Signed)
PRN Postop ?

## 2021-11-07 ENCOUNTER — Ambulatory Visit (INDEPENDENT_AMBULATORY_CARE_PROVIDER_SITE_OTHER): Payer: Managed Care, Other (non HMO)

## 2021-11-07 ENCOUNTER — Ambulatory Visit (INDEPENDENT_AMBULATORY_CARE_PROVIDER_SITE_OTHER): Payer: Managed Care, Other (non HMO) | Admitting: Podiatry

## 2021-11-07 ENCOUNTER — Other Ambulatory Visit: Payer: Self-pay

## 2021-11-07 DIAGNOSIS — Z9889 Other specified postprocedural states: Secondary | ICD-10-CM

## 2021-11-07 DIAGNOSIS — M2041 Other hammer toe(s) (acquired), right foot: Secondary | ICD-10-CM

## 2021-11-07 NOTE — Progress Notes (Signed)
? ?  Subjective:  ?Patient presents today status post bunionectomy with double osteotomy and fifth toe arthroplasty right. DOS: 11/01/2021.  Patient states that she is doing well.  The pain has improved over the past few days.  She is minimally weightbearing in the cam boot as instructed.  She is kept the dressings clean dry and intact as instructed.  No new complaints at this time ? ?Past Medical History:  ?Diagnosis Date  ? 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 History of ectopic pregnancy   ? LEFT RUPTURED ECTOPIC PREG. S/P  SALPINGECTOMY 09-20-2015  ? Hydrosalpinx   ? RIGHT  ? Pregnancy resulting from in vitro fertilization, third trimester 03/17/2018  ? Shoulder dystocia during labor and delivery, delivered 03/17/2018  ? Wears glasses   ? ?Past Surgical History:  ?Procedure Laterality Date  ? CESAREAN SECTION MULTI-GESTATIONAL N/A 05/31/2021  ? Procedure: CESAREAN SECTION MULTI-GESTATIONAL;  Surgeon: Azucena Fallen, MD;  Location: [place] [facility]S;  Service: Obstetrics;  Laterality: N/A;  ? LAPAROSCOPY N/A 09/20/2015  ? Procedure: LAPAROSCOPY DIAGNOSTIC;  Surgeon: Azucena Fallen, MD;  Location: [place] [facility];  Service: Gynecology;  Laterality: N/A;  ? LAPAROSCOPY N/A 03/25/2017  ? Procedure: LAPAROSCOPY OPERATIVE;  Surgeon: Governor Specking, MD;  Location: [facility];  Service: Gynecology;  Laterality: N/A;  ? LYSIS OF ADHESION N/A 03/25/2017  ? Procedure: LAPAROSCOPY, LYSIS OF ADHESION, BILATERAL SALPINGECTOMY, CHROMOPERTUBATION, ENDOMETRIAL BIOPSY;  Surgeon: Governor Specking, MD;  Location[facility];  Service: Gynecology;  Laterality: N/A;  ? UNILATERAL SALPINGECTOMY Left 09/20/2015  ? Procedure: UNILATERAL SALPINGECTOMY;  Surgeon: Azucena Fallen, MD;  Location: [place] [facility];  Service: Gynecology;  Laterality: Left;  ? ?No Known Allergies ? ? ?Objective/Physical Exam ?Neurovascular status intact.  Skin incisions appear to be well coapted with sutures intact. No sign of infectious process noted. No  dehiscence. No active bleeding noted. Moderate edema noted to the surgical extremity. ? ?Radiographic Exam:  ?Orthopedic hardware and osteotomies sites appear to be stable with routine healing.  There is some slight gapping to the oblique Aiken osteotomy site of the proximal phalanx however the orthopedic screw appears intact and stable. ? ?Assessment: ?1. s/p bunionectomy with double osteotomy and fifth toe arthroplasty right. DOS: 11/01/2021 ? ? ?Plan of Care:  ?1. Patient was evaluated. X-rays reviewed ?2.  Dressings changed.  Clean dry and intact x1 week ?3.  Continue minimal weightbearing in the cam boot ?4.  Return to clinic in 1 week for reevaluation and possible suture removal ?5.  Patient states that she would like to have her other foot bunion surgery done within the same.  Out of work.  We had discussed possible surgery to the left foot previously.  We will discuss surgery and obtain surgical consent next visit ?6.  Refrain from work.  Minimal weightbearing. ? ? ?Edrick Kins, DPM ?[place] ? ?Dr. Edrick Kins, DPM  ?  ?[address]                                    ?[place], [place] [address]                ?Office [phone]  ?Fax [phone] ? ? ? ? ? ?

## 2021-11-14 ENCOUNTER — Ambulatory Visit (INDEPENDENT_AMBULATORY_CARE_PROVIDER_SITE_OTHER): Payer: Managed Care, Other (non HMO) | Admitting: Podiatry

## 2021-11-14 ENCOUNTER — Other Ambulatory Visit: Payer: Self-pay

## 2021-11-14 DIAGNOSIS — Z9889 Other specified postprocedural states: Secondary | ICD-10-CM

## 2021-11-14 MED ORDER — DOXYCYCLINE HYCLATE 100 MG PO TABS: 100.0000 mg | ORAL_TABLET | Freq: Two times a day (BID) | ORAL | 0 refills | Status: DC

## 2021-11-14 NOTE — Progress Notes (Signed)
? ?  Subjective:  ?Patient presents today status post bunionectomy with double osteotomy and fifth toe arthroplasty right. DOS: 11/01/2021.  Patient states that she is doing well.  The pain has improved over the past few days.  She is minimally weightbearing in the cam boot as instructed.  She is kept the dressings clean dry and intact as instructed.  No new complaints at this time ? ?Past Medical History:  ?Diagnosis Date  ? 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 History of ectopic pregnancy   ? LEFT RUPTURED ECTOPIC PREG. S/P  SALPINGECTOMY 09-20-2015  ? Hydrosalpinx   ? RIGHT  ? Pregnancy resulting from in vitro fertilization, third trimester 03/17/2018  ? Shoulder dystocia during labor and delivery, delivered 03/17/2018  ? Wears glasses   ? ?Past Surgical History:  ?Procedure Laterality Date  ? CESAREAN SECTION MULTI-GESTATIONAL N/A 05/31/2021  ? Procedure: CESAREAN SECTION MULTI-GESTATIONAL;  Surgeon: Azucena Fallen, MD;  Location: [place] LD ORS;  Service: Obstetrics;  Laterality: N/A;  ? LAPAROSCOPY N/A 09/20/2015  ? Procedure: LAPAROSCOPY DIAGNOSTIC;  Surgeon: Azucena Fallen, MD;  Location: [place] [facility];  Service: Gynecology;  Laterality: N/A;  ? LAPAROSCOPY N/A 03/25/2017  ? Procedure: LAPAROSCOPY OPERATIVE;  Surgeon: Governor Specking, MD;  Location: [facility];  Service: Gynecology;  Laterality: N/A;  ? LYSIS OF ADHESION N/A 03/25/2017  ? Procedure: LAPAROSCOPY, LYSIS OF ADHESION, BILATERAL SALPINGECTOMY, CHROMOPERTUBATION, ENDOMETRIAL BIOPSY;  Surgeon: Governor Specking, MD;  Location: [facility];  Service: Gynecology;  Laterality: N/A;  ? UNILATERAL SALPINGECTOMY Left 09/20/2015  ? Procedure: UNILATERAL SALPINGECTOMY;  Surgeon: Azucena Fallen, MD;  Location: [place] [facility];  Service: Gynecology;  Laterality: Left;  ? ?No Known Allergies ? ? ?Objective/Physical Exam ?Neurovascular status intact.  Skin incisions appear to be well coapted with sutures intact.  There is some very slight erythema just  around the incisional site which may be concerning with a low-grade postoperative infection. No dehiscence. No active bleeding noted. Moderate edema noted to the surgical extremity. ? ?Assessment: ?1. s/p bunionectomy with double osteotomy and fifth toe arthroplasty right. DOS: 11/01/2021 ? ? ?Plan of Care:  ?1. Patient was evaluated. ?2.  Sutures removed ?3.  Due to the slight erythema around the incision site we did prescribe doxycycline 100 mg 2 times daily #20 ?4.  Continue weightbearing in the cam boot ?5.  Continue Ace wrap daily ?6.  Patient will need to refrain from work for 4 weeks.  No work even from home. ?7.  Return to clinic in 2 weeks for follow-up x-ray ? ?*Works at [employer].  Husband is Sal.  ? ? ?Edrick Kins, DPM ?[place] ? ?Dr. Edrick Kins, DPM  ?  ?[address]                                    ?[place], [place] [address]                ?Office [phone]  ?Fax [phone] ? ? ? ? ? ?

## 2021-11-19 ENCOUNTER — Other Ambulatory Visit: Payer: Self-pay | Admitting: Podiatry

## 2021-11-28 ENCOUNTER — Ambulatory Visit (INDEPENDENT_AMBULATORY_CARE_PROVIDER_SITE_OTHER): Payer: Managed Care, Other (non HMO) | Admitting: Podiatry

## 2021-11-28 ENCOUNTER — Ambulatory Visit (INDEPENDENT_AMBULATORY_CARE_PROVIDER_SITE_OTHER): Payer: Managed Care, Other (non HMO)

## 2021-11-28 DIAGNOSIS — M2041 Other hammer toe(s) (acquired), right foot: Secondary | ICD-10-CM

## 2021-11-28 DIAGNOSIS — Z9889 Other specified postprocedural states: Secondary | ICD-10-CM

## 2021-12-01 DIAGNOSIS — Z419 Encounter for procedure for purposes other than remedying health state, unspecified: Secondary | ICD-10-CM | POA: Diagnosis not present

## 2021-12-02 NOTE — Progress Notes (Signed)
? ?  Subjective:  ?Patient presents today status post bunionectomy with double osteotomy and fifth toe arthroplasty right. DOS: 11/01/2021.  Overall the patient is doing very well.  She has been weightbearing in the cam boot as instructed.  She only has aching pain when she is on her foot for a long period of time. ? ?Past Medical History:  ?Diagnosis Date  ? 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 History of ectopic pregnancy   ? LEFT RUPTURED ECTOPIC PREG. S/P  SALPINGECTOMY 09-20-2015  ? Hydrosalpinx   ? RIGHT  ? Pregnancy resulting from in vitro fertilization, third trimester 03/17/2018  ? Shoulder dystocia during labor and delivery, delivered 03/17/2018  ? Wears glasses   ? ?Past Surgical History:  ?Procedure Laterality Date  ? CESAREAN SECTION MULTI-GESTATIONAL N/A 05/31/2021  ? Procedure: CESAREAN SECTION MULTI-GESTATIONAL;  Surgeon: Shea Skyann Ganim, MD;  Location: [facility];  Service: Obstetrics;  Laterality: N/A;  ? LAPAROSCOPY N/A 09/20/2015  ? Procedure: LAPAROSCOPY DIAGNOSTIC;  Surgeon: Shea Rashanda Magloire, MD;  Location: [facility];  Service: Gynecology;  Laterality: N/A;  ? LAPAROSCOPY N/A 03/25/2017  ? Procedure: LAPAROSCOPY OPERATIVE;  Surgeon: Fermin Schwab, MD;  Location: [facility];  Service: Gynecology;  Laterality: N/A;  ? LYSIS OF ADHESION N/A 03/25/2017  ? Procedure: LAPAROSCOPY, LYSIS OF ADHESION, BILATERAL SALPINGECTOMY, CHROMOPERTUBATION, ENDOMETRIAL BIOPSY;  Surgeon: Fermin Schwab, MD;  Location: [facility];  Service: Gynecology;  Laterality: N/A;  ? UNILATERAL SALPINGECTOMY Left 09/20/2015  ? Procedure: UNILATERAL SALPINGECTOMY;  Surgeon: Shea Aireona Torelli, MD;  Location: [facility];  Service: Gynecology;  Laterality: Left;  ? ?No Known Allergies ? ? ?Objective/Physical Exam ?Neurovascular status intact.  Skin incisions healed.  Erythema resolved.  There is some minimal edema noted to the foot.  Negative for any significant tenderness to palpation ? ?Assessment: ?1. s/p  bunionectomy with double osteotomy and fifth toe arthroplasty right. DOS: 11/01/2021 ? ? ?Plan of Care:  ?1. Patient was evaluated. ?2.  Overall patient is doing well.  She may return to work 12/12/2021 ?3.  Patient may begin to transition out of the cam boot ?4.  Compression ankle sleeves were dispensed today.  Wear daily ?5.  Return to clinic in 4 weeks ? ?*Works at [employer].  Husband is Sal.  ? ? ?Felecia Shelling, DPM ?[facility] ? ?Dr. Felecia Shelling, DPM  ?  ?[address]                                    ?[place], [place] [address]                ?Office [phone]  ?Fax [phone] ? ? ? ? ? ?

## 2021-12-12 ENCOUNTER — Telehealth: Payer: Self-pay | Admitting: Podiatry

## 2021-12-12 NOTE — Telephone Encounter (Signed)
Patient was still experiencing a lot of pain and scheduled to return to work 12/12/2021. Leave was extended until 12/24/2021 she wanted to see you before returning to work, appt. Scheduled for 12/19/2021. ?

## 2021-12-17 ENCOUNTER — Encounter: Payer: Managed Care, Other (non HMO) | Admitting: Podiatry

## 2021-12-19 ENCOUNTER — Encounter: Payer: Managed Care, Other (non HMO) | Admitting: Podiatry

## 2021-12-31 ENCOUNTER — Ambulatory Visit (INDEPENDENT_AMBULATORY_CARE_PROVIDER_SITE_OTHER): Payer: Managed Care, Other (non HMO)

## 2021-12-31 ENCOUNTER — Ambulatory Visit (INDEPENDENT_AMBULATORY_CARE_PROVIDER_SITE_OTHER): Payer: Managed Care, Other (non HMO) | Admitting: Podiatry

## 2021-12-31 DIAGNOSIS — Z419 Encounter for procedure for purposes other than remedying health state, unspecified: Secondary | ICD-10-CM | POA: Diagnosis not present

## 2021-12-31 DIAGNOSIS — Z9889 Other specified postprocedural states: Secondary | ICD-10-CM

## 2021-12-31 NOTE — Progress Notes (Signed)
? ?  Subjective:  ?Patient presents today status post bunionectomy with double osteotomy and fifth toe arthroplasty right. DOS: 11/01/2021.  Patient states that she is doing well however she continues to have some edema and swelling to the foot.  She complains more of stiffness and tightness instead of actual pain.  She really has no pain associated to the surgical area.  Her main issues today are stiffness and swelling ? ?Past Medical History:  ?Diagnosis Date  ? 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 History of ectopic pregnancy   ? LEFT RUPTURED ECTOPIC PREG. S/P  SALPINGECTOMY 09-20-2015  ? Hydrosalpinx   ? RIGHT  ? Pregnancy resulting from in vitro fertilization, third trimester 03/17/2018  ? Shoulder dystocia during labor and delivery, delivered 03/17/2018  ? Wears glasses   ? ?Past Surgical History:  ?Procedure Laterality Date  ? CESAREAN SECTION MULTI-GESTATIONAL N/A 05/31/2021  ? Procedure: CESAREAN SECTION MULTI-GESTATIONAL;  Surgeon: Shea Larrie Lucia, MD;  Location: [facility];  Service: Obstetrics;  Laterality: N/A;  ? LAPAROSCOPY N/A 09/20/2015  ? Procedure: LAPAROSCOPY DIAGNOSTIC;  Surgeon: Shea Veretta Sabourin, MD;  Location: [facility];  Service: Gynecology;  Laterality: N/A;  ? LAPAROSCOPY N/A 03/25/2017  ? Procedure: LAPAROSCOPY OPERATIVE;  Surgeon: Fermin Schwab, MD;  Location: [facility];  Service: Gynecology;  Laterality: N/A;  ? LYSIS OF ADHESION N/A 03/25/2017  ? Procedure: LAPAROSCOPY, LYSIS OF ADHESION, BILATERAL SALPINGECTOMY, CHROMOPERTUBATION, ENDOMETRIAL BIOPSY;  Surgeon: Fermin Schwab, MD;  Location: [facility];  Service: Gynecology;  Laterality: N/A;  ? UNILATERAL SALPINGECTOMY Left 09/20/2015  ? Procedure: UNILATERAL SALPINGECTOMY;  Surgeon: Shea Carmelite Violet, MD;  Location: [facility];  Service: Gynecology;  Laterality: Left;  ? ?No Known Allergies ? ? ?Objective/Physical Exam ?Neurovascular status intact.  Skin incisions healed.  Erythema resolved.  There continues to be  moderate edema to the foot.  Negative for any significant pain on palpation.  Limited range of motion of the first MTP joint ? ?Assessment: ?1. s/p bunionectomy with double osteotomy and fifth toe arthroplasty right. DOS: 11/01/2021 ? ? ?Plan of Care:  ?1. Patient was evaluated. ?2.  Order placed for physical therapy at [facility] to increase range of motion and help with the stiffness and edema. ?3.  Patient may slowly transition out of the cam boot into good supportive shoes and sneakers ?4.  Return to clinic 6 weeks ? ?*Works at [employer].  Husband is Sal.  ? ? ?Felecia Shelling, DPM ?[facility] ? ?Dr. Felecia Shelling, DPM  ?  ?[address]                                    ?[place], [place] [address]                ?Office [phone]  ?Fax [phone] ? ? ? ? ? ?

## 2022-01-04 ENCOUNTER — Telehealth: Payer: Self-pay | Admitting: Podiatry

## 2022-01-04 NOTE — Telephone Encounter (Signed)
Continue light duty for 6 additional weeks.  Thanks, Dr. Logan Bores

## 2022-01-04 NOTE — Telephone Encounter (Signed)
I need a work note status, patient returned to work 12/19/2021, is she light duty or no restrictions. ?

## 2022-01-07 ENCOUNTER — Encounter: Payer: Self-pay | Admitting: Podiatry

## 2022-01-29 ENCOUNTER — Telehealth: Payer: Self-pay | Admitting: Podiatry

## 2022-01-29 NOTE — Telephone Encounter (Signed)
Could we please follow up with this? Thanks, Dr. Amalia Hailey

## 2022-01-29 NOTE — Telephone Encounter (Signed)
Pt states she was seen on 12/31/21 by Dr Logan Bores and he ordered PT at [facility] but she has never heard from them and its now 30 days later.  She is scheduled to follow up with Dr Logan Bores on 6/12. Pt is concerned as she states her foot is "not right" She is not happy at the fact no one called to scheduled her therapy.  Please advise.

## 2022-01-30 ENCOUNTER — Other Ambulatory Visit: Payer: Self-pay | Admitting: *Deleted

## 2022-01-30 DIAGNOSIS — Z9889 Other specified postprocedural states: Secondary | ICD-10-CM

## 2022-01-30 NOTE — Telephone Encounter (Signed)
Referral has been faxed to [facility]), treat and evaluate, confirmation received,patient aware.01/30/22

## 2022-01-30 NOTE — Progress Notes (Unsigned)
ref

## 2022-01-31 DIAGNOSIS — Z419 Encounter for procedure for purposes other than remedying health state, unspecified: Secondary | ICD-10-CM | POA: Diagnosis not present

## 2022-02-11 ENCOUNTER — Telehealth: Payer: Self-pay | Admitting: *Deleted

## 2022-02-11 ENCOUNTER — Ambulatory Visit: Payer: Managed Care, Other (non HMO) | Admitting: Podiatry

## 2022-02-11 NOTE — Telephone Encounter (Signed)
Patient calling because she missed her appointment today and wanted to let the physician know that she had her first PT appointment on 06/08, 2nd session is 06/13, still stiff, wanted to work on foot a little longer, thought needed more movement before scheduling. Please advise.

## 2022-02-11 NOTE — Telephone Encounter (Signed)
Sounds good.  Thanks for the update.-Dr. Logan Bores

## 2022-02-11 NOTE — Telephone Encounter (Signed)
Patient has been notified and will call to schedule after next session

## 2022-03-02 DIAGNOSIS — Z419 Encounter for procedure for purposes other than remedying health state, unspecified: Secondary | ICD-10-CM | POA: Diagnosis not present

## 2022-03-18 ENCOUNTER — Ambulatory Visit: Payer: Managed Care, Other (non HMO) | Admitting: Podiatry

## 2022-04-01 ENCOUNTER — Ambulatory Visit (INDEPENDENT_AMBULATORY_CARE_PROVIDER_SITE_OTHER): Payer: Managed Care, Other (non HMO) | Admitting: Podiatry

## 2022-04-01 DIAGNOSIS — Z9889 Other specified postprocedural states: Secondary | ICD-10-CM

## 2022-04-01 NOTE — Progress Notes (Signed)
   Chief Complaint  Patient presents with   Follow-up     6WK FU ON PHYSICAL THERAPY    Subjective:  Patient presents today status post bunionectomy with double osteotomy and fifth toe arthroplasty right. DOS: 11/01/2021.  Patient states that she is doing very well.  She has no pain and tenderness to the foot.  No swelling.  She completed physical therapy.  She states that she did not like physical therapy because she did not receive the attention she felt like she needed to rehab her foot.  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 S/P  SALPINGECTOMY 09-20-2015   Hydrosalpinx    RIGHT   Pregnancy resulting from in vitro fertilization, third trimester 03/17/2018   Shoulder dystocia during labor and delivery, delivered 03/17/2018   Wears glasses    Past Surgical History:  Procedure Laterality Date   CESAREAN SECTION MULTI-GESTATIONAL N/A 05/31/2021   Procedure: CESAREAN SECTION MULTI-GESTATIONAL;  Surgeon: Shea Eleno Weimar, MD;  [facility]: [facility];  Service: Obstetrics;  Laterality: N/A;   LAPAROSCOPY N/A 09/20/2015   Procedure: LAPAROSCOPY DIAGNOSTIC;  Surgeon: Shea Simonne Boulos, MD;  Location: [facility];  Service: Gynecology;  Laterality: N/A;   LAPAROSCOPY N/A 03/25/2017   Procedure: LAPAROSCOPY OPERATIVE;  Surgeon: Fermin Schwab, MD;  Location: [facility];  Service: Gynecology;  Laterality: N/A;   LYSIS OF ADHESION N/A 03/25/2017   Procedure: LAPAROSCOPY, LYSIS OF ADHESION, BILATERAL SALPINGECTOMY, CHROMOPERTUBATION, ENDOMETRIAL BIOPSY;  Surgeon: Fermin Schwab, MD;  Location: [facility];  Service: Gynecology;  Laterality: N/A;   UNILATERAL SALPINGECTOMY Left 09/20/2015   Procedure: UNILATERAL SALPINGECTOMY;  Surgeon: Shea Cristhian Vanhook, MD;  Location: [facility];  Service: Gynecology;  Laterality: Left;   No Known  Allergies   Objective/Physical Exam Neurovascular status intact.  Skin incisions healed.  Good range of motion of the first MTP joint.  No pain on palpation or range of motion.  No crepitus.  Assessment: 1. s/p bunionectomy with double osteotomy and fifth toe arthroplasty right. DOS: 11/01/2021   Plan of Care:  1. Patient was evaluated. 2.  Patient has no pain or tenderness and she has been increasing her walking.  She completed physical therapy.  She may now resume full activity no restrictions 3.  Recommend good supportive shoes and sneakers 4.  Return to clinic as needed  *Works at [employer] but looking for a new job.  Husband is Sal. He caters food. Website: [email or web address]   Felecia Shelling, DPM [facility]  Dr. Felecia Shelling, DPM    [address] [place], [place] [address]                Office [phone]  Fax [phone]

## 2022-04-02 DIAGNOSIS — Z419 Encounter for procedure for purposes other than remedying health state, unspecified: Secondary | ICD-10-CM | POA: Diagnosis not present

## 2022-05-03 DIAGNOSIS — Z419 Encounter for procedure for purposes other than remedying health state, unspecified: Secondary | ICD-10-CM | POA: Diagnosis not present

## 2022-06-02 DIAGNOSIS — Z419 Encounter for procedure for purposes other than remedying health state, unspecified: Secondary | ICD-10-CM | POA: Diagnosis not present

## 2022-07-03 DIAGNOSIS — Z419 Encounter for procedure for purposes other than remedying health state, unspecified: Secondary | ICD-10-CM | POA: Diagnosis not present

## 2022-08-02 DIAGNOSIS — Z419 Encounter for procedure for purposes other than remedying health state, unspecified: Secondary | ICD-10-CM | POA: Diagnosis not present

## 2022-09-02 DIAGNOSIS — Z419 Encounter for procedure for purposes other than remedying health state, unspecified: Secondary | ICD-10-CM | POA: Diagnosis not present

## 2022-10-03 DIAGNOSIS — Z419 Encounter for procedure for purposes other than remedying health state, unspecified: Secondary | ICD-10-CM | POA: Diagnosis not present

## 2022-11-01 DIAGNOSIS — Z419 Encounter for procedure for purposes other than remedying health state, unspecified: Secondary | ICD-10-CM | POA: Diagnosis not present

## 2022-12-02 DIAGNOSIS — Z419 Encounter for procedure for purposes other than remedying health state, unspecified: Secondary | ICD-10-CM | POA: Diagnosis not present

## 2023-01-01 DIAGNOSIS — Z419 Encounter for procedure for purposes other than remedying health state, unspecified: Secondary | ICD-10-CM | POA: Diagnosis not present

## 2023-02-01 DIAGNOSIS — Z419 Encounter for procedure for purposes other than remedying health state, unspecified: Secondary | ICD-10-CM | POA: Diagnosis not present

## 2023-03-03 DIAGNOSIS — Z419 Encounter for procedure for purposes other than remedying health state, unspecified: Secondary | ICD-10-CM | POA: Diagnosis not present

## 2023-04-03 DIAGNOSIS — Z419 Encounter for procedure for purposes other than remedying health state, unspecified: Secondary | ICD-10-CM | POA: Diagnosis not present

## 2023-05-04 DIAGNOSIS — Z419 Encounter for procedure for purposes other than remedying health state, unspecified: Secondary | ICD-10-CM | POA: Diagnosis not present

## 2023-06-03 DIAGNOSIS — Z419 Encounter for procedure for purposes other than remedying health state, unspecified: Secondary | ICD-10-CM | POA: Diagnosis not present

## 2023-06-25 LAB — HM PAP SMEAR

## 2023-07-04 DIAGNOSIS — Z419 Encounter for procedure for purposes other than remedying health state, unspecified: Secondary | ICD-10-CM | POA: Diagnosis not present

## 2023-08-03 DIAGNOSIS — Z419 Encounter for procedure for purposes other than remedying health state, unspecified: Secondary | ICD-10-CM | POA: Diagnosis not present

## 2023-09-03 DIAGNOSIS — Z419 Encounter for procedure for purposes other than remedying health state, unspecified: Secondary | ICD-10-CM | POA: Diagnosis not present

## 2023-09-24 DIAGNOSIS — F430 Acute stress reaction: Secondary | ICD-10-CM | POA: Diagnosis not present

## 2023-09-24 DIAGNOSIS — F4381 Prolonged grief disorder: Secondary | ICD-10-CM | POA: Diagnosis not present

## 2023-10-01 DIAGNOSIS — F4381 Prolonged grief disorder: Secondary | ICD-10-CM | POA: Diagnosis not present

## 2023-10-01 DIAGNOSIS — F430 Acute stress reaction: Secondary | ICD-10-CM | POA: Diagnosis not present

## 2023-10-04 DIAGNOSIS — Z419 Encounter for procedure for purposes other than remedying health state, unspecified: Secondary | ICD-10-CM | POA: Diagnosis not present

## 2023-10-13 ENCOUNTER — Ambulatory Visit: Payer: Medicaid Other | Admitting: Family Medicine

## 2023-10-20 DIAGNOSIS — F430 Acute stress reaction: Secondary | ICD-10-CM | POA: Diagnosis not present

## 2023-10-20 DIAGNOSIS — F4381 Prolonged grief disorder: Secondary | ICD-10-CM | POA: Diagnosis not present

## 2023-10-21 ENCOUNTER — Ambulatory Visit (INDEPENDENT_AMBULATORY_CARE_PROVIDER_SITE_OTHER): Payer: Medicaid Other | Admitting: Family Medicine

## 2023-10-21 ENCOUNTER — Encounter: Payer: Self-pay | Admitting: Family Medicine

## 2023-10-21 VITALS — BP 122/80 | HR 113 | Temp 98.6°F | Ht 66.0 in | Wt 239.0 lb

## 2023-10-21 DIAGNOSIS — F418 Other specified anxiety disorders: Secondary | ICD-10-CM | POA: Diagnosis not present

## 2023-10-21 DIAGNOSIS — R7309 Other abnormal glucose: Secondary | ICD-10-CM | POA: Insufficient documentation

## 2023-10-21 DIAGNOSIS — D649 Anemia, unspecified: Secondary | ICD-10-CM

## 2023-10-21 DIAGNOSIS — F4322 Adjustment disorder with anxiety: Secondary | ICD-10-CM | POA: Insufficient documentation

## 2023-10-21 NOTE — Progress Notes (Signed)
Patient Office Visit  Assessment & Plan:  Elevated glucose -     COMPLETE METABOLIC PANEL WITH GFR -     Hemoglobin A1c -     Lipid panel  Anemia, unspecified type -     CBC with Differential/Platelet  Situational anxiety   Test results were reviewed and analyzed as part of the medical decision making of this visit.  Reviewed OB/GYN notes on her phone and previous notes in epic.  We will repeat labs today.  Continue healthy diet and consistent exercise gradual weight loss.  Return for physical next time.  Patient will let us know if she reconsiders starting an SSRI.  Patient declined the flu shot. Return if symptoms worsen or fail to improve, for physical.   Subjective:    Patient ID: Penny Horne, female    DOB: 06-02-1981  Age: 43 y.o. MRN: [id number]  Chief Complaint  Patient presents with   Medical Management of Chronic Issues   Establish Care    HPI Situational stress/anxiety- pt seeing psychotherapist in [place]-  [facility] [place] face to face- Ferne Reus DNP/FNP.   Has a lot of things going on in her life i.e. taking care of 4 kids, taking care of her mom from [place] who is in [place] facility in [place] and also adding an addition to her house to hopefully care for her mom in the future.Pt is also considering Law School in the fall. Pt will take LSAT in the future. Patient has 4 kids at home. 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 Pt does not think she needs medication at this time. Pt going through a spiritual awakening.  Patient does not think she needs medication for anxiety and stress but will let us know if she changes her mind. Patient not sure if the phentermine is causing some anxiety but she thinks the anxiety was there before she started phentermine.  Patient will stop taking the phentermine in the next few weeks.   Prescription was given for a total of 3 months.  Patient is concerned about her ability to focus especially considering law school.  Patient does have a lot of things going on her life and multitasking on a daily basis. Obesity/BMI 38- pt is on phentermine per OB-GYN Dr. Juliene Pina. Pt lost about 30 pounds since being on Phentermine. Pt has been walking, some weight lifting.  History of Ectopic Pregnancy- followed by OB-GYN re this. No hx of gestational diabetes. Pt has 2 kids. Of her own and her partner has 2 kids.   Previous history of anemia-when she was pregnant she becomes very anemic but had a CBC in October which was normal.  Patient does eat healthy overall. Elevated glucose-patient had labs done in October to the OB/GYN office and A1c was 5.7.  Patient was not aware that this is in the prediabetes range.  Patient is eating healthier overall and is active.  Has lost about 30 pounds in the last 3 months or so. HM- has UTD mammogram. Pt never gets flu shots.  The ASCVD Risk score (Arnett DK, et al., 2019) failed to calculate for the following reasons:   Cannot find a previous HDL lab   Cannot find a previous total cholesterol lab  Past Medical History:  Diagnosis Date   Anemia 01/15/2018   During pregnancy   Female pelvic peritoneal  adhesions    History of ectopic pregnancy    LEFT RUPTURED ECTOPIC PREG. S/P  SALPINGECTOMY 09-20-2015   Hydrosalpinx    RIGHT   Pregnancy resulting from in vitro fertilization, third trimester 03/17/2018   Shoulder dystocia during labor and delivery, delivered 03/17/2018   Wears glasses    Past Surgical History:  Procedure Laterality Date   CESAREAN SECTION MULTI-GESTATIONAL N/A 05/31/2021   Procedure: CESAREAN SECTION MULTI-GESTATIONAL;  Surgeon: Shea Evans, MD;  Location: [facility];  Service: Obstetrics;  Laterality: N/A;   LAPAROSCOPY N/A 09/20/2015   Procedure: LAPAROSCOPY DIAGNOSTIC;  Surgeon: Shea Evans, MD;  Location: [facility];  Service:  Gynecology;  Laterality: N/A;   LAPAROSCOPY N/A 03/25/2017   Procedure: LAPAROSCOPY OPERATIVE;  Surgeon: Fermin Schwab, MD;  Location: [facility];  Service: Gynecology;  Laterality: N/A;   LYSIS OF ADHESION N/A 03/25/2017   Procedure: LAPAROSCOPY, LYSIS OF ADHESION, BILATERAL SALPINGECTOMY, CHROMOPERTUBATION, ENDOMETRIAL BIOPSY;  Surgeon: Fermin Schwab, MD;  [facility];  Service: Gynecology;  Laterality: N/A;   UNILATERAL SALPINGECTOMY Left 09/20/2015   Procedure: UNILATERAL SALPINGECTOMY;  Surgeon: Shea Evans, MD;  Location: [facility];  Service: Gynecology;  Laterality: Left;   Social History   Tobacco Use   Smoking status: Former    Current packs/day: 0.00    Average packs/day: 0.3 packs/day for 10.0 years (2.5 ttl pk-yrs)    Types: Cigarettes    Start date: 09/05/1995    Quit date: 09/05/2015    Years since quitting: 8.1   Smokeless tobacco: Never   Tobacco comments:    No longer smoke  Substance Use Topics   Alcohol use: Not Currently    Comment: Every blue moon   Drug use: No   Family History  Problem Relation Age of Onset   Transient ischemic attack Mother        Rehab facitiy   HIV Mother    Diabetes Father        age 60 died   Drug abuse Father    Transient ischemic attack Father    Stroke Father    Drug abuse Sister    Drug abuse Sister    Drug abuse Brother    Drug abuse Brother    Intellectual disability Brother    Vision loss Paternal Grandmother    Early death Son    Breast cancer Neg Hx    Uterine cancer Neg Hx    Ovarian cancer Neg Hx    Colon cancer Neg Hx    No Known Allergies  ROS    Objective:    BP 122/80   Pulse (!) 113   Temp 98.6 F (37 C)   Ht 5\' 6"  (1.676 m)   Wt 239 lb (108.4 kg)   LMP 10/14/2023 (Exact Date)   SpO2 99%   Breastfeeding No   BMI 38.58 kg/m  BP Readings from Last 3 Encounters:  10/21/23 122/80  06/03/21 (!) 104/53  03/18/18 123/67   Wt Readings from Last 3  Encounters:  10/21/23 239 lb (108.4 kg)  05/20/21 281 lb (127.5 kg)  03/16/18 248 lb 9.6 oz (112.8 kg)    Physical Exam Vitals and nursing note reviewed.  Constitutional:      General: She is not in acute distress.    Appearance: Normal appearance.  HENT:     Head: Normocephalic.     Right Ear: Tympanic membrane, ear canal and external ear normal.     Left Ear: Tympanic membrane, ear canal  and external ear normal.  Eyes:     Extraocular Movements: Extraocular movements intact.     Conjunctiva/sclera: Conjunctivae normal.     Pupils: Pupils are equal, round, and reactive to light.  Cardiovascular:     Rate and Rhythm: Normal rate and regular rhythm.     Heart sounds: Normal heart sounds.  Pulmonary:     Effort: Pulmonary effort is normal. No respiratory distress.     Breath sounds: Normal breath sounds.  Musculoskeletal:     Right lower leg: No edema.     Left lower leg: No edema.  Neurological:     General: No focal deficit present.     Mental Status: She is alert and oriented to person, place, and time. Mental status is at baseline.  Psychiatric:        Attention and Perception: Attention normal.        Mood and Affect: Mood and affect normal.        Speech: Speech normal.        Behavior: Behavior normal.        Thought Content: Thought content normal.        Judgment: Judgment normal.      No results found for any visits on 10/21/23.

## 2023-10-22 ENCOUNTER — Encounter: Payer: Self-pay | Admitting: Family Medicine

## 2023-10-22 LAB — CBC WITH DIFFERENTIAL/PLATELET
Absolute Lymphocytes: 2499 {cells}/uL (ref 850–3900)
Absolute Monocytes: 462 {cells}/uL (ref 200–950)
Basophils Absolute: 21 {cells}/uL (ref 0–200)
Basophils Relative: 0.3 %
Eosinophils Absolute: 112 {cells}/uL (ref 15–500)
Eosinophils Relative: 1.6 %
HCT: 39.9 % (ref 35.0–45.0)
Hemoglobin: 12.9 g/dL (ref 11.7–15.5)
MCH: 30 pg (ref 27.0–33.0)
MCHC: 32.3 g/dL (ref 32.0–36.0)
MCV: 92.8 fL (ref 80.0–100.0)
MPV: 11.1 fL (ref 7.5–12.5)
Monocytes Relative: 6.6 %
Neutro Abs: 3906 {cells}/uL (ref 1500–7800)
Neutrophils Relative %: 55.8 %
Platelets: 288 10*3/uL (ref 140–400)
RBC: 4.3 10*6/uL (ref 3.80–5.10)
RDW: 12.2 % (ref 11.0–15.0)
Total Lymphocyte: 35.7 %
WBC: 7 10*3/uL (ref 3.8–10.8)

## 2023-10-22 LAB — COMPLETE METABOLIC PANEL WITH GFR
AG Ratio: 1.7 (calc) (ref 1.0–2.5)
ALT: 24 U/L (ref 6–29)
AST: 22 U/L (ref 10–30)
Albumin: 4.3 g/dL (ref 3.6–5.1)
Alkaline phosphatase (APISO): 59 U/L (ref 31–125)
BUN: 11 mg/dL (ref 7–25)
CO2: 27 mmol/L (ref 20–32)
Calcium: 9.9 mg/dL (ref 8.6–10.2)
Chloride: 105 mmol/L (ref 98–110)
Creat: 0.86 mg/dL (ref 0.50–0.99)
Globulin: 2.6 g/dL (ref 1.9–3.7)
Glucose, Bld: 89 mg/dL (ref 65–99)
Potassium: 4 mmol/L (ref 3.5–5.3)
Sodium: 139 mmol/L (ref 135–146)
Total Bilirubin: 0.5 mg/dL (ref 0.2–1.2)
Total Protein: 6.9 g/dL (ref 6.1–8.1)
eGFR: 86 mL/min/{1.73_m2} (ref >=60)

## 2023-10-22 LAB — HEMOGLOBIN A1C
Hgb A1c MFr Bld: 5.6 %{Hb} (ref <5.7)
Mean Plasma Glucose: 114 mg/dL
eAG (mmol/L): 6.3 mmol/L

## 2023-10-22 LAB — LIPID PANEL
Cholesterol: 199 mg/dL (ref <200)
HDL: 68 mg/dL (ref >=50)
LDL Cholesterol (Calc): 116 mg/dL — ABNORMAL HIGH
Non-HDL Cholesterol (Calc): 131 mg/dL — ABNORMAL HIGH (ref <130)
Total CHOL/HDL Ratio: 2.9 (calc) (ref <5.0)
Triglycerides: 56 mg/dL (ref <150)

## 2023-10-27 ENCOUNTER — Encounter: Payer: Self-pay | Admitting: Family Medicine

## 2023-10-27 ENCOUNTER — Ambulatory Visit (INDEPENDENT_AMBULATORY_CARE_PROVIDER_SITE_OTHER): Payer: Medicaid Other | Admitting: Family Medicine

## 2023-10-27 VITALS — BP 126/78 | HR 101 | Temp 98.5°F | Ht 66.0 in | Wt 238.4 lb

## 2023-10-27 DIAGNOSIS — F4322 Adjustment disorder with anxiety: Secondary | ICD-10-CM

## 2023-10-27 DIAGNOSIS — E669 Obesity, unspecified: Secondary | ICD-10-CM | POA: Diagnosis not present

## 2023-10-27 DIAGNOSIS — Z0001 Encounter for general adult medical examination with abnormal findings: Secondary | ICD-10-CM

## 2023-10-27 DIAGNOSIS — Z6841 Body Mass Index (BMI) 40.0 and over, adult: Secondary | ICD-10-CM | POA: Insufficient documentation

## 2023-10-27 DIAGNOSIS — Z0000 Encounter for general adult medical examination without abnormal findings: Secondary | ICD-10-CM | POA: Insufficient documentation

## 2023-10-27 MED ORDER — ESCITALOPRAM OXALATE 10 MG PO TABS: 10.0000 mg | ORAL_TABLET | Freq: Every day | ORAL | 1 refills | Status: DC

## 2023-10-27 NOTE — Addendum Note (Signed)
 Addended by: Renee Pain on: 10/27/2023 10:04 AM   Modules accepted: Orders

## 2023-10-27 NOTE — Progress Notes (Signed)
 Complete physical exam  Assessment & Plan:    Routine Health Maintenance and Physical Exam Discussed health benefits of physical activity, and encouraged her to engage in regular exercise appropriate for her age and condition.  Preventative health care  Adjustment disorder with anxiety -     Escitalopram Oxalate; Take 1 tablet (10 mg total) by mouth daily.  Dispense: 90 tablet; Refill: 1  Obesity (BMI 30-39.9) -     Ambulatory Referral to [facility]   Patient will start Lexapro 10 mg once a day.  Return in 4 to 6 weeks or sooner if necessary.  Continue healthy diet consistent exercise.  Referral made to the wellness center at [facility] to continue with weight loss strategies/options. Return in about 5 weeks (around 12/01/2023), or if symptoms worsen or fail to improve.        Subjective:  Patient ID: Penny Horne, female    DOB: 18-Mar-1981  Age: 43 y.o. MRN: [id number] Chief Complaint  Patient presents with   Annual Exam    Penny Horne is a 43 y.o. female who presents today for a complete physical exam. Colonoscopy-not yet Pap smear- UTD per Dr. Sharlynn Oliphant in October 2024 Mammogram - UTD Tetanus- UTD Shingrix-not due yet Flu shot - declines Pt has been working out consistently. Eating healthier overall.  Anxiety- Patient has been dealing with a lot of stress/trauma but has been able to released some of this since last office visit. pt would like to start medication. Pt took Lexapro in the past and it did help her. Pt is open to this now.  Patient thinks the Lexapro caused some insomnia but she did take it in the morning.  Patient will monitor symptoms and side effects when she starts the Lexapro.  Pt also doing psychotherapy which helps.  Obesity- pt has been on Phentermine and would like to have other weight loss options. Pt has lost over 30 pounds on this medication but knows she cannot take it long term. Pt was Rx by her OB-GYN. Pt has been more motivated to  work out and eating healthier overall. Pt has 5 pills left and is concerned that she will start to over eat. Pt does not think her insurance covers [place] or Zepbound.  Patient was in the prediabetes range before but then we repeated the A1c last week it was normal.  Believes it is due to the weight loss that she has experienced in the last 3 months.  Health Maintenance  Topic Date Due   Hepatitis C Screening  Never done   COVID-19 Vaccine (3 - 2024-25 season) 11/12/2023*   Flu Shot  12/01/2023*   Pap with HPV screening  06/24/2026   DTaP/Tdap/Td vaccine (3 - Td or Tdap) 05/29/2031   HIV Screening  Completed   HPV Vaccine  Aged Out  *Topic was postponed. The date shown is not the original due date.    Most recent fall risk assessment:    10/21/2023    8:10 AM  Fall Risk   Falls in the past year? 0  Number falls in past yr: 0  Injury with Fall? 0     Most recent depression screenings:    10/27/2023    9:13 AM 10/27/2023    9:12 AM  PHQ 2/9 Scores  PHQ - 2 Score 0 0  PHQ- 9 Score 4       The 10-year ASCVD risk score (Arnett DK, et al., 2019) is: 0.4%  Values used to calculate the score:     Age: 1 years     Sex: Female     Is Non-Hispanic African American: Yes     Diabetic: No     Tobacco smoker: No     Systolic Blood Pressure: 126 mmHg     Is BP treated: No     HDL Cholesterol: 68 mg/dL     Total Cholesterol: 199 mg/dL  Past Surgical History:  Procedure Laterality Date   CESAREAN SECTION     CESAREAN SECTION MULTI-GESTATIONAL N/A 05/31/2021   Procedure: CESAREAN SECTION MULTI-GESTATIONAL;  Surgeon: Shea Evans, MD;  Location: [facility];  Service: Obstetrics;  Laterality: N/A;   LAPAROSCOPY N/A 09/20/2015   Procedure: LAPAROSCOPY DIAGNOSTIC;  Surgeon: Shea Evans, MD;  Location: [facility];  Service: Gynecology;  Laterality: N/A;   LAPAROSCOPY N/A 03/25/2017   Procedure: LAPAROSCOPY OPERATIVE;  Surgeon: Fermin Schwab, MD;  Location[facility];  Service: Gynecology;  Laterality: N/A;   LYSIS OF ADHESION N/A 03/25/2017   Procedure: LAPAROSCOPY, LYSIS OF ADHESION, BILATERAL SALPINGECTOMY, CHROMOPERTUBATION, ENDOMETRIAL BIOPSY;  Surgeon: Fermin Schwab, MD;  Location[facility];  Service: Gynecology;  Laterality: N/A;   UNILATERAL SALPINGECTOMY Left 09/20/2015   Procedure: UNILATERAL SALPINGECTOMY;  Surgeon: Shea Evans, MD;  Location: [facility];  Service: Gynecology;  Laterality: Left;   Social History   Tobacco Use   Smoking status: Former    Current packs/day: 0.00    Average packs/day: 0.3 packs/day for 11.7 years (3.0 ttl pk-yrs)    Types: Cigarettes    Start date: 09/05/1995    Quit date: 09/05/2015    Years since quitting: 8.1   Smokeless tobacco: Never   Tobacco comments:    No longer smoke  Substance Use Topics   Alcohol use: Not Currently    Comment: Every blue moon   Drug use: No   Social History   Socioeconomic History   Marital status: Married    Spouse name: Not on file   Number of children: Not on file   Years of education: Not on file   Highest education level: Master's degree (e.g., MA, MS, MEng, MEd, MSW, MBA)  Occupational History   Not on file  Tobacco Use   Smoking status: Former    Current packs/day: 0.00    Average packs/day: 0.3 packs/day for 11.7 years (3.0 ttl pk-yrs)    Types: Cigarettes    Start date: 09/05/1995    Quit date: 09/05/2015    Years since quitting: 8.1   Smokeless tobacco: Never   Tobacco comments:    No longer smoke  Substance and Sexual Activity   Alcohol use: Not Currently    Comment: Every blue moon   Drug use: No   Sexual activity: Yes    Partners: Male    Birth control/protection: Pill  Other Topics Concern   Not on file  Social History Narrative   Lives in [place], has catering business. Interested in going to [employer]. Pt is working out at [employer]    Social Drivers of Corporate investment banker Strain: Low Risk  (10/09/2023)   Overall  Financial Resource Strain (CARDIA)    Difficulty of Paying Living Expenses: Not hard at all  Food Insecurity: No Food Insecurity (10/09/2023)   Hunger Vital Sign    Worried About Running Out of Food in the Last Year: Never true    Ran Out of Food in the Last Year: Never true  Transportation Needs: No Transportation Needs (10/09/2023)   PRAPARE - Administrator, Civil Service (Medical): No    Lack of Transportation (Non-Medical): No  Physical Activity: Sufficiently Active (10/09/2023)   Exercise Vital Sign    Days of Exercise per Week: 3 days    Minutes of Exercise per Session: 60 min  Stress: Stress Concern Present (10/09/2023)   [employer] of Occupational Health - Occupational Stress Questionnaire    Feeling of Stress : To some extent  Social Connections: Moderately Isolated (10/09/2023)   Social Connection and Isolation Panel [NHANES]    Frequency of Communication with Friends and Family: Three times a week    Frequency of Social Gatherings with Friends and Family: Never    Attends Religious Services: Never    Database administrator or Organizations: No    Attends Engineer, structural: Not on file    Marital Status: Married  Catering manager Violence: Not on file   Family History  Problem Relation Age of Onset   Transient ischemic attack Mother        Rehab facitiy   HIV Mother    Diabetes Father        age 51 died   Drug abuse Father    Transient ischemic attack Father    Stroke Father    Drug abuse Sister    Drug abuse Sister    Drug abuse Brother    Drug abuse Brother    Intellectual disability Brother        died 16   Vision loss Paternal Grandmother    Early death Son    Breast cancer Neg Hx    Uterine cancer Neg Hx    Ovarian cancer Neg Hx    Colon cancer Neg Hx    No Known Allergies   Patient Care Team: Bernadette Hoit, MD as PCP - General (Family Medicine)   Outpatient Medications Prior to Visit  Medication Sig   INCASSIA 0.35 MG  tablet Take 1 tablet by mouth daily.   phentermine 37.5 MG capsule Take 37.5 mg by mouth every morning.   No facility-administered medications prior to visit.    ROS     Objective:    BP 126/78   Pulse (!) 101   Temp 98.5 F (36.9 C)   Ht 5\' 6"  (1.676 m)   Wt 238 lb 6 oz (108.1 kg)   LMP 10/14/2023 (Exact Date)   SpO2 98%   BMI 38.47 kg/m  BP Readings from Last 3 Encounters:  10/27/23 126/78  10/21/23 122/80  06/03/21 (!) 104/53   Wt Readings from Last 3 Encounters:  10/27/23 238 lb 6 oz (108.1 kg)  10/21/23 239 lb (108.4 kg)  05/20/21 281 lb (127.5 kg)    Physical Exam Vitals and nursing note reviewed.  Constitutional:      Appearance: Normal appearance.  HENT:     Head: Normocephalic.     Right Ear: Tympanic membrane, ear canal and external ear normal.     Left Ear: Tympanic membrane, ear canal and external ear normal.     Nose: Nose normal.     Mouth/Throat:     Mouth: Mucous membranes are moist.     Pharynx: Oropharynx is clear.  Eyes:     Extraocular Movements: Extraocular movements intact.     Conjunctiva/sclera: Conjunctivae normal.     Pupils: Pupils are equal, round, and reactive to light.  Neck:     Thyroid: No thyroid mass, thyromegaly or  thyroid tenderness.  Cardiovascular:     Rate and Rhythm: Normal rate and regular rhythm.     Heart sounds: Normal heart sounds.  Pulmonary:     Effort: Pulmonary effort is normal.     Breath sounds: Normal breath sounds.  Chest:  Breasts:    Breasts are symmetrical.     Right: Normal. No inverted nipple, mass, nipple discharge or tenderness.     Left: Tenderness present. No inverted nipple, mass or nipple discharge.  Abdominal:     General: Bowel sounds are normal.     Palpations: Abdomen is soft.     Tenderness: There is no abdominal tenderness. There is no guarding.  Genitourinary:    Comments: Breasts have piercing.  Musculoskeletal:        General: No tenderness. Normal range of motion.      Cervical back: Normal range of motion and neck supple.     Right lower leg: No edema.     Left lower leg: No edema.  Lymphadenopathy:     Upper Body:     Right upper body: No supraclavicular or axillary adenopathy.     Left upper body: No supraclavicular or axillary adenopathy.  Skin:    General: Skin is warm and dry.     Comments: Patient has numerous tatoos  Neurological:     General: No focal deficit present.     Mental Status: She is alert and oriented to person, place, and time. Mental status is at baseline.     Cranial Nerves: No cranial nerve deficit.     Sensory: No sensory deficit.     Motor: No weakness.     Coordination: Coordination normal.     Gait: Gait normal.     Deep Tendon Reflexes: Reflexes normal.  Psychiatric:        Mood and Affect: Mood normal.        Behavior: Behavior normal.        Thought Content: Thought content normal.        Judgment: Judgment normal.      Results for orders placed or performed in visit on 10/27/23  HM PAP SMEAR  Result Value Ref Range   HM Pap smear [facility]         Bernadette Hoit, MD

## 2023-10-27 NOTE — Addendum Note (Signed)
 Addended by: Angelica Chessman on: 10/27/2023 10:01 AM   Modules accepted: Orders

## 2023-11-01 DIAGNOSIS — Z419 Encounter for procedure for purposes other than remedying health state, unspecified: Secondary | ICD-10-CM | POA: Diagnosis not present

## 2023-11-18 ENCOUNTER — Ambulatory Visit: Payer: Medicaid Other | Admitting: Family Medicine

## 2023-12-01 ENCOUNTER — Ambulatory Visit (INDEPENDENT_AMBULATORY_CARE_PROVIDER_SITE_OTHER): Payer: Medicaid Other | Admitting: Family Medicine

## 2023-12-01 ENCOUNTER — Encounter: Payer: Self-pay | Admitting: Family Medicine

## 2023-12-01 VITALS — BP 126/88 | HR 68 | Temp 98.5°F | Ht 66.0 in | Wt 248.0 lb

## 2023-12-01 DIAGNOSIS — Z6841 Body Mass Index (BMI) 40.0 and over, adult: Secondary | ICD-10-CM | POA: Diagnosis not present

## 2023-12-01 DIAGNOSIS — F4322 Adjustment disorder with anxiety: Secondary | ICD-10-CM

## 2023-12-01 DIAGNOSIS — E669 Obesity, unspecified: Secondary | ICD-10-CM | POA: Diagnosis not present

## 2023-12-01 MED ORDER — ESCITALOPRAM OXALATE 20 MG PO TABS: 20.0000 mg | ORAL_TABLET | Freq: Every day | ORAL | 1 refills | Status: DC

## 2023-12-01 NOTE — Progress Notes (Signed)
 Patient Office Visit  Assessment & Plan:  Adjustment disorder with anxiety -     Escitalopram Oxalate; Take 1 tablet (20 mg total) by mouth daily.  Dispense: 90 tablet; Refill: 1  Obesity (BMI 30-39.9)   Will increase Lexapro to 20 mg once a day.  Patient will designate quiet space for her to study for LSATs. Continue healthy diet and consistent exercise.  Patient does have an upcoming appointment with weight management in April.  If she has any issues with the 20 mg she will let us know. No follow-ups on file.   Subjective:    Patient ID: Penny Horne, female    DOB: 11/16/1980  Age: 43 y.o. MRN: [id number]  Chief Complaint  Patient presents with   Medical Management of Chronic Issues    Wants to increase lexapro dose.    HPI Anxiety- here for follow up .  Patient has started the Lexapro 10 mg once a day has noticed a difference in her anxiety level and mood, it feels much improved and she does not feel as overwhelmed.  Patient not having any negative side effects with the medication.  Patient thinks it does need to be increased a little bit.  Has been sleeping better overall Pt feels better overall, has alone time in the morning which is positive. Pt completed personal statement for [employer] application.  Pt got her mom moved into her home this past weekend which was a big stressor for her in the past. Her mom is very appreciative that she has a place in patient's home.  patient feels like a big weight has been lifted and is a big relief.  Patient feels more positive and motivated about the future and feels like she can go forward with her plans to go to law school.  Patient now has to start studying for the LSAT's and apply to schools Elevated BMI-patient has been more active and eating healthier overall.  Patient has been doing some weight lifting and thinks he has gained some muscle mass since the last office visit.  Patient does have an upcoming appointment with weight  management.  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 S/P  SALPINGECTOMY 09-20-2015   Hydrosalpinx    RIGHT   Pregnancy resulting from in vitro fertilization, third trimester 03/17/2018   Shoulder dystocia during labor and delivery, delivered 03/17/2018   Wears glasses    Past Surgical History:  Procedure Laterality Date   CESAREAN SECTION     CESAREAN SECTION MULTI-GESTATIONAL N/A 05/31/2021   Procedure: CESAREAN SECTION MULTI-GESTATIONAL;  Surgeon: Shea Evans, MD;  Location: [facility];  Service: Obstetrics;  Laterality: N/A;   LAPAROSCOPY N/A 09/20/2015   Procedure: LAPAROSCOPY DIAGNOSTIC;  Surgeon: Shea Evans, MD;  Location: [facility];  Service: Gynecology;  Laterality: N/A;   LAPAROSCOPY N/A 03/25/2017   Procedure: LAPAROSCOPY OPERATIVE;  Surgeon: Fermin Schwab, MD;  Location: [facility];  Service: Gynecology;  Laterality: N/A;   LYSIS OF ADHESION N/A 03/25/2017   Procedure: LAPAROSCOPY, LYSIS OF ADHESION, BILATERAL SALPINGECTOMY, CHROMOPERTUBATION, ENDOMETRIAL BIOPSY;  Surgeon: Fermin Schwab, MD;  [facility];  Service: Gynecology;  Laterality: N/A;   UNILATERAL SALPINGECTOMY  Left 09/20/2015   Procedure: UNILATERAL SALPINGECTOMY;  Surgeon: Shea Evans, MD;  Location: [facility];  Service: Gynecology;  Laterality: Left;   Social History   Tobacco Use   Smoking status: Former    Current packs/day: 0.00    Average packs/day: 0.3 packs/day for 11.7 years (3.0 ttl pk-yrs)    Types: Cigarettes    Start date: 09/05/1995    Quit date: 09/05/2015    Years since quitting: 8.2   Smokeless tobacco: Never   Tobacco comments:    No longer smoke  Substance Use  Topics   Alcohol use: Not Currently    Comment: Every blue moon   Drug use: No   Family History  Problem Relation Age of Onset   Transient ischemic attack Mother        Rehab facitiy   HIV Mother    Diabetes Father        age 80 died   Drug abuse Father    Transient ischemic attack Father    Stroke Father    Drug abuse Sister    Drug abuse Sister    Drug abuse Brother    Drug abuse Brother    Intellectual disability Brother        died 23   Vision loss Paternal Grandmother    Early death Son    Breast cancer Neg Hx    Uterine cancer Neg Hx    Ovarian cancer Neg Hx    Colon cancer Neg Hx    No Known Allergies  ROS    Objective:    BP 126/88   Pulse 68   Temp 98.5 F (36.9 C)   Ht 5\' 6"  (1.676 m)   Wt 248 lb (112.5 kg)   LMP 11/11/2023 (Approximate)   SpO2 99%   BMI 40.03 kg/m  BP Readings from Last 3 Encounters:  12/01/23 126/88  10/27/23 126/78  10/21/23 122/80   Wt Readings from Last 3 Encounters:  12/01/23 248 lb (112.5 kg)  10/27/23 238 lb 6 oz (108.1 kg)  10/21/23 239 lb (108.4 kg)    Physical Exam Vitals and nursing note reviewed.  Constitutional:      Appearance: Normal appearance.  HENT:     Head: Normocephalic.     Right Ear: Tympanic membrane and ear canal normal.     Left Ear: Tympanic membrane and ear canal normal.  Eyes:     Extraocular Movements: Extraocular movements intact.     Conjunctiva/sclera: Conjunctivae normal.     Pupils: Pupils are equal, round, and reactive to light.  Cardiovascular:     Rate and Rhythm: Normal rate and regular rhythm.     Heart sounds: Normal heart sounds.  Pulmonary:     Effort: Pulmonary effort is normal.     Breath sounds: Normal breath sounds. No wheezing.  Musculoskeletal:     Right lower leg: No edema.     Left lower leg: No edema.  Neurological:     General: No focal deficit present.     Mental Status: She is alert and oriented to person, place, and time.  Psychiatric:        Mood and  Affect: Mood normal.        Behavior: Behavior normal.      No results found for any visits on 12/01/23.

## 2023-12-09 ENCOUNTER — Ambulatory Visit (INDEPENDENT_AMBULATORY_CARE_PROVIDER_SITE_OTHER): Admitting: Adult Health

## 2023-12-09 ENCOUNTER — Encounter (INDEPENDENT_AMBULATORY_CARE_PROVIDER_SITE_OTHER): Payer: Self-pay | Admitting: Adult Health

## 2023-12-09 VITALS — BP 136/75 | HR 64 | Temp 98.0°F | Ht 66.0 in | Wt 244.0 lb

## 2023-12-09 DIAGNOSIS — D649 Anemia, unspecified: Secondary | ICD-10-CM

## 2023-12-09 DIAGNOSIS — E669 Obesity, unspecified: Secondary | ICD-10-CM

## 2023-12-09 DIAGNOSIS — R7309 Other abnormal glucose: Secondary | ICD-10-CM

## 2023-12-09 DIAGNOSIS — Z6839 Body mass index (BMI) 39.0-39.9, adult: Secondary | ICD-10-CM | POA: Diagnosis not present

## 2023-12-09 DIAGNOSIS — Z0289 Encounter for other administrative examinations: Secondary | ICD-10-CM

## 2023-12-09 NOTE — Progress Notes (Signed)
 Office: [phone]  /  Fax: [phone]   Initial Visit  Penny Horne was seen in clinic today to evaluate for obesity. She is interested in losing weight to improve overall health and reduce the risk of weight related complications. She presents today to review program treatment options, initial physical assessment, and evaluation.     Discussed the use of AI scribe software for clinical note transcription with the patient, who gave verbal consent to proceed.  History of Present Illness        Weight gain after last two pregnancies, two youngest children are 31 and 68 years of age  She was referred by: PCP  When asked what else they would like to accomplish? She states: Adopt healthier eating patterns and Improve quality of life  When asked how has your weight affected you? She states: Contributed to orthopedic problems or mobility issues, Having fatigue, Having poor endurance, and Problems with eating patterns  Weight history:  Weight gain after last two pregnancies, two youngest children are 24 and 33 years of age Max weight 280 lbs Oct 2024- she started regular exercise and 6 months of Phentermine- current weight 244 lbs  Some associated conditions: None  Contributing factors: Consumption of processed foods, Moderate to high levels of stress, and Pregnancy  Weight promoting medications identified: Psychotropic medications  Current nutrition plan: Other: Mediterranean Diet   Current level of physical activity: None, Walking 20 minutes, four  a week, Running / Jogging 20 minutes, four , and Strength training 20 minutes, four   Current or previous pharmacotherapy: Phentermine  Response to medication: Lost weight and was able to maintain weight loss Stopped Phentermine Feb 2025   Past medical history includes:   Past Medical History:  Diagnosis Date   Anemia 01/15/2018   During pregnancy   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 S/P  SALPINGECTOMY 09-20-2015   Hydrosalpinx    RIGHT   Pregnancy resulting from in vitro fertilization, third trimester 03/17/2018   Shoulder dystocia during labor and delivery, delivered 03/17/2018   Wears glasses      Objective:   BP 136/75   Pulse 64   Temp 98 F (36.7 C)   Ht 5\' 6"  (1.676 m)   Wt 244 lb (110.7 kg)   LMP 12/07/2023 (Approximate)   SpO2 99%   BMI 39.38 kg/m  She was weighed on the bioimpedance scale: Body mass index is 39.38 kg/m.  Peak Weight:280 , Body Fat%:46.6, Visceral Fat Rating:13, Weight trend over the last 12 months: Decreasing  General:  Alert, oriented and cooperative. Patient is in no acute distress.  Respiratory: Normal respiratory effort, no problems with respiration noted   Gait: able to ambulate independently  Mental Status: Normal mood and affect. Normal behavior. Normal judgment and thought content.   DIAGNOSTIC DATA REVIEWED:  BMET    Component Value Date/Time   NA 139 10/21/2023 0844   K 4.0 10/21/2023 0844   CL 105 10/21/2023 0844   CO2 27 10/21/2023 0844   GLUCOSE 89 10/21/2023 0844   BUN 11 10/21/2023 0844   CREATININE 0.86 10/21/2023 0844   CALCIUM 9.9 10/21/2023 0844   GFRNONAA >60 05/21/2021 0007   GFRAA >60 09/20/2015 0930   Lab Results  Component Value Date   HGBA1C 5.6 10/21/2023   No results found for: "INSULIN" CBC    Component Value Date/Time   WBC 7.0 10/21/2023 0844   RBC 4.30  10/21/2023 0844   HGB 12.9 10/21/2023 0844   HCT 39.9 10/21/2023 0844   PLT 288 10/21/2023 0844   MCV 92.8 10/21/2023 0844   MCH 30.0 10/21/2023 0844   MCHC 32.3 10/21/2023 0844   RDW 12.2 10/21/2023 0844   Iron/TIBC/Ferritin/ %Sat No results found for: "IRON", "TIBC", "FERRITIN", "IRONPCTSAT" Lipid Panel     Component Value Date/Time   CHOL 199 10/21/2023 0844   TRIG 56 10/21/2023 0844   HDL 68 10/21/2023 0844   CHOLHDL 2.9 10/21/2023 0844   LDLCALC 116 (H) 10/21/2023 0844   Hepatic Function  Panel     Component Value Date/Time   PROT 6.9 10/21/2023 0844   ALBUMIN 2.9 (L) 05/21/2021 0007   AST 22 10/21/2023 0844   ALT 24 10/21/2023 0844   ALKPHOS 65 05/21/2021 0007   BILITOT 0.5 10/21/2023 0844   No results found for: "TSH"   Assessment and Plan:   Obesity (BMI 30-39.9)  Anemia, unspecified type  Elevated glucose    Assessment and Plan      ESTABLISH WITH [facility]   Obesity Treatment / Action Plan:  Patient will work on garnering support from family and friends to begin weight loss journey. Will work on eliminating or reducing the presence of highly palatable, calorie dense foods in the home. Will complete provided nutritional and psychosocial assessment questionnaire before the next appointment. Will be scheduled for indirect calorimetry to determine resting energy expenditure in a fasting state.  This will allow [place] to create a reduced calorie, high-protein meal plan to promote loss of fat mass while preserving muscle mass. Counseled on the health benefits of losing 5%-15% of total body weight. Was counseled on nutritional approaches to weight loss and benefits of reducing processed foods and consuming plant-based foods and high quality protein as part of nutritional weight management. Was counseled on pharmacotherapy and role as an adjunct in weight management.   Obesity Education Performed Today:  She was weighed on the bioimpedance scale and results were discussed and documented in the synopsis.  We discussed obesity as a disease and the importance of a more detailed evaluation of all the factors contributing to the disease.  We discussed the importance of long term lifestyle changes which include nutrition, exercise and behavioral modifications as well as the importance of customizing this to her specific health and social needs.  We discussed the benefits of reaching a healthier weight to alleviate the symptoms of existing conditions and reduce the risks of  the biomechanical, metabolic and psychological effects of obesity.  Davy C Teaney appears to be in the action stage of change and states they are ready to start intensive lifestyle modifications and behavioral modifications.  I have spent 30 minutes in the care of the patient today including: preparing to see patient (e.g. review and interpretation of tests, old notes ), obtaining and/or reviewing separately obtained history, performing a medically appropriate examination or evaluation, counseling and educating the patient, documenting clinical information in the electronic or other health care record, and independently interpreting results and communicating results to the patient, family, or caregiver   Reviewed by clinician on day of visit: allergies, medications, problem list, medical history, surgical history, family history, social history, and previous encounter notes pertinent to obesity diagnosis.  Eaden Hettinger d. Keldrick Pomplun, NP-C

## 2023-12-13 DIAGNOSIS — Z419 Encounter for procedure for purposes other than remedying health state, unspecified: Secondary | ICD-10-CM | POA: Diagnosis not present

## 2024-01-12 ENCOUNTER — Ambulatory Visit (INDEPENDENT_AMBULATORY_CARE_PROVIDER_SITE_OTHER): Admitting: Family Medicine

## 2024-01-12 ENCOUNTER — Encounter: Payer: Self-pay | Admitting: Family Medicine

## 2024-01-12 VITALS — BP 124/80 | HR 83 | Temp 98.5°F | Ht 66.0 in | Wt 255.1 lb

## 2024-01-12 DIAGNOSIS — Z6841 Body Mass Index (BMI) 40.0 and over, adult: Secondary | ICD-10-CM | POA: Diagnosis not present

## 2024-01-12 DIAGNOSIS — F988 Other specified behavioral and emotional disorders with onset usually occurring in childhood and adolescence: Secondary | ICD-10-CM | POA: Diagnosis not present

## 2024-01-12 DIAGNOSIS — F4322 Adjustment disorder with anxiety: Secondary | ICD-10-CM

## 2024-01-12 DIAGNOSIS — Z419 Encounter for procedure for purposes other than remedying health state, unspecified: Secondary | ICD-10-CM | POA: Diagnosis not present

## 2024-01-12 NOTE — Progress Notes (Unsigned)
 Patient Office Visit  Assessment & Plan:  Adjustment disorder with anxiety  BMI 40.0-44.9, adult (HCC)  Attention deficit disorder predominant inattentive type   Assessment and Plan    Anxiety Symptoms improved after discontinuing medications, including Vyvanse. Lifestyle changes contributed to improvement. Discussed recognizing when medication might be needed again. - Monitor symptoms and consider medication if symptoms recur. - Encourage continued lifestyle modifications, including adequate sleep and self-care.  Weight gain Weight gain possibly due to increased appetite after stopping Vyvanse. Engaging in a weight management program with meal prepping and healthier eating habits. - Attend weight management appointment on May 14. - Continue meal prepping and focus on healthy eating habits.  Goals of Care Discussed need for more comprehensive care for her mother due to declining independence. Emphasized importance of safety and pursuing personal goals without guilt. - Explore options for her mother's care that ensure safety and meet her needs. - Continue to communicate with family about care decisions and personal goals.      Patient will let me know if she needs to restart medication in the future.  Return in about 8 weeks (around 03/08/2024), or if symptoms worsen or fail to improve.   Subjective:     Patient ID: Penny Horne, female    DOB: [birth year]  Age: 43 y.o. MRN: [id number]  Chief Complaint  Patient presents with   Medical Management of Chronic Issues    HPI Discussed the use of AI scribe software for clinical note transcription with the patient, who gave verbal consent to proceed.      History of Present Illness Penny Horne is a 43 year old female who presents for follow-up regarding her anxiety management and recent medication changes. Patient was also dx with ADD  She had been experiencing anxiety and had her medication dose increased. Her therapist  advised adding Vyvanse 30 mg to her regimen due to ongoing anxiety and potential attention issues. She started Vyvanse 30mg  once per day but discontinued all medications after about a week and a half, feeling she no longer needed them. She weaned off the medications gradually and has been off them for about two weeks, feeling 'great' since then.  While on Vyvanse, she experienced appetite suppression, which led to some weight gain after discontinuation. She has been focusing on improving her sleep and has been taking short naps twice a week, which she finds beneficial. She has also been sleeping well at night.  She has been working on her nutrition, eating more fruits and vegetables, although she admits a weakness for meats like oxtails and curry. She has started meal prepping to manage her eating habits better. She is involved in a weight management program and has an appointment scheduled for May 14th to continue this process.  She is preparing for her LSAT testing, which she postponed to ensure she is ready. She is managing multiple responsibilities, including caring for her mother, who requires assistance and is resistant to taking her medications. She is navigating the challenges of balancing her personal goals with family obligations.  Assessment and Plan Anxiety Symptoms improved after discontinuing medications, including Vyvanse. Lifestyle changes contributed to improvement. Discussed recognizing when medication might be needed again. - Monitor symptoms and consider medication if symptoms recur. - Encourage continued lifestyle modifications, including adequate sleep and self-care.  Weight gain/ADD Weight gain possibly due to increased appetite after stopping Vyvanse. Engaging in a weight management program with meal prepping and healthier eating habits. - Attend weight management  appointment on May 14. - Continue meal prepping and focus on healthy eating habits.  Goals of Care Discussed  need for more comprehensive care for her mother due to declining independence. Emphasized importance of safety and pursuing personal goals without guilt. - Explore options for her mother's care that ensure safety and meet her needs. - Continue to communicate with family about care decisions and personal goals.     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 S/P  SALPINGECTOMY 09-20-2015   Hydrosalpinx    RIGHT   Pregnancy resulting from in vitro fertilization, third trimester 03/17/2018   Shoulder dystocia during labor and delivery, delivered 03/17/2018   Wears glasses    Past Surgical History:  Procedure Laterality Date   CESAREAN SECTION     CESAREAN SECTION MULTI-GESTATIONAL N/A 05/31/2021   Procedure: CESAREAN SECTION MULTI-GESTATIONAL;  Surgeon: Terri Fester, MD;  Location: [facility];  Service: Obstetrics;  Laterality: N/A;   LAPAROSCOPY N/A 09/20/2015   Procedure: LAPAROSCOPY DIAGNOSTIC;  Surgeon: Terri Fester, MD;  Location: [facility];  Service: Gynecology;  Laterality: N/A;   LAPAROSCOPY N/A 03/25/2017   Procedure: LAPAROSCOPY OPERATIVE;  Surgeon: Yalcinkaya, Tamer, MD;  Location: [facility];  Service: Gynecology;  Laterality: N/A;   LYSIS OF ADHESION N/A 03/25/2017   Procedure: LAPAROSCOPY, LYSIS OF ADHESION, BILATERAL SALPINGECTOMY, CHROMOPERTUBATION, ENDOMETRIAL BIOPSY;  Surgeon: Yalcinkaya, Tamer, MD;  Location: [facility];  Service: Gynecology;  Laterality: N/A;   UNILATERAL SALPINGECTOMY Left 09/20/2015   Procedure: UNILATERAL SALPINGECTOMY;  Surgeon: Terri Fester, MD;  Location: [facility];  Service: Gynecology;  Laterality: Left;   Social History   Tobacco Use   Smoking status: Former    Current packs/day: 0.00    Average packs/day: 0.3  packs/day for 11.7 years (3.0 ttl pk-yrs)    Types: Cigarettes    Start date: 09/05/1995    Quit date: 09/05/2015    Years since quitting: 8.3   Smokeless tobacco: Never   Tobacco comments:    No longer smoke  Vaping Use   Vaping status: Never Used  Substance Use Topics   Alcohol use: Not Currently    Comment: Every blue moon   Drug use: No   Family History  Problem Relation Age of Onset   Transient ischemic attack Mother        Rehab facitiy   HIV Mother    Diabetes Father        age 82 died   Drug abuse Father    Transient ischemic attack Father    Stroke Father    Drug abuse Sister    Drug abuse Sister    Drug abuse Brother    Drug abuse Brother    Intellectual disability Brother        died 78   Vision loss Paternal Grandmother    Early death Son    Breast cancer Neg Hx    Uterine cancer Neg Hx    Ovarian cancer Neg Hx    Colon cancer Neg Hx    No Known Allergies  ROS    Objective:    BP 124/80   Pulse 83   Temp 98.5 F (36.9 C)   Ht 5\' 6"  (1.676 m)   Wt 255 lb 2 oz (115.7  kg)   LMP 01/06/2024   SpO2 99%   BMI 41.18 kg/m  BP Readings from Last 3 Encounters:  01/12/24 124/80  12/09/23 136/75  12/01/23 126/88   Wt Readings from Last 3 Encounters:  01/12/24 255 lb 2 oz (115.7 kg)  12/09/23 244 lb (110.7 kg)  12/01/23 248 lb (112.5 kg)    Physical Exam Vitals and nursing note reviewed.  Constitutional:      Appearance: Normal appearance.  HENT:     Head: Normocephalic.     Right Ear: Tympanic membrane, ear canal and external ear normal.     Left Ear: Tympanic membrane, ear canal and external ear normal.  Eyes:     Extraocular Movements: Extraocular movements intact.     Pupils: Pupils are equal, round, and reactive to light.  Cardiovascular:     Rate and Rhythm: Normal rate and regular rhythm.     Heart sounds: Normal heart sounds.  Pulmonary:     Effort: Pulmonary effort is normal.     Breath sounds: Normal breath sounds.   Musculoskeletal:     Right lower leg: No edema.     Left lower leg: No edema.  Neurological:     General: No focal deficit present.     Mental Status: She is alert and oriented to person, place, and time.  Psychiatric:        Attention and Perception: Attention normal.        Mood and Affect: Mood normal.        Behavior: Behavior normal.        Thought Content: Thought content normal.        Cognition and Memory: Cognition normal.      No results found for any visits on 01/12/24.

## 2024-01-14 ENCOUNTER — Ambulatory Visit (INDEPENDENT_AMBULATORY_CARE_PROVIDER_SITE_OTHER): Admitting: Family Medicine

## 2024-01-14 ENCOUNTER — Encounter (INDEPENDENT_AMBULATORY_CARE_PROVIDER_SITE_OTHER): Payer: Self-pay | Admitting: Family Medicine

## 2024-01-14 VITALS — BP 104/67 | HR 82 | Temp 97.8°F | Ht 66.0 in | Wt 250.0 lb

## 2024-01-14 DIAGNOSIS — R0602 Shortness of breath: Secondary | ICD-10-CM | POA: Diagnosis not present

## 2024-01-14 DIAGNOSIS — Z6841 Body Mass Index (BMI) 40.0 and over, adult: Secondary | ICD-10-CM

## 2024-01-14 DIAGNOSIS — R5383 Other fatigue: Secondary | ICD-10-CM | POA: Diagnosis not present

## 2024-01-14 DIAGNOSIS — E785 Hyperlipidemia, unspecified: Secondary | ICD-10-CM

## 2024-01-14 DIAGNOSIS — E7800 Pure hypercholesterolemia, unspecified: Secondary | ICD-10-CM

## 2024-01-14 DIAGNOSIS — Z1331 Encounter for screening for depression: Secondary | ICD-10-CM

## 2024-01-14 NOTE — Progress Notes (Signed)
 Chief Complaint:  Obesity   Subjective:  Penny Horne (MR# [id number]) is a 43 y.o. female who presents for evaluation and treatment of obesity and related comorbidities.   Penny Horne is currently in the action stage of change and ready to dedicate time achieving and maintaining a healthier weight. Penny Horne is interested in becoming our patient and working on intensive lifestyle modifications including (but not limited to) diet and exercise for weight loss.  Penny Horne has been struggling with her weight. She has been unsuccessful in either losing weight, maintaining weight loss, or reaching her healthy weight goal.  She was previously on phentermine for weight loss and lost 30lbs.  She is off the Vyvanse and Lexapro  now for about 3 weeks. Working out and changing her food intake has helped with her anxiety.  She is going to start law school soon.  She was in the gym more frequently previously.  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  Lives at home with her husband Sylvan (78), bonus son Macon Scala (48), bonus daughter Sanai(59), daughter Cyrilla Drivers), son [name] (2) and mother Clara.  Her mother is bedridden and they are considering long term facilities.  Family is supportive of her, they eat meals together, and they occasionally will be eating healthier with her.  Desired weight is 175lbs.  Last time she was that weight was when she was 43 years old.   Food recall: Coffee in the am with cream and sugar (1 tbsp of both).  May have supplement at 10:30 1 piece toast with cinnamon sugar or 2 boiled eggs or spinach with onions and garlic.  Feels like she should eat something.  Around 1:30/2pm may do grilled cheese (1) or ox tail plate or [employer] Caesar salad with fried shrimp and chili sauce or [employer]'s salad (won't eat whole thing).  Feels satisfied from lunch. May eat again 7-8pm like chicken tenders, fried chicken or hot wings (6) and salad.  Feels full.  Indirect  Calorimeter completed today shows a RMR: 2002.  Her calculated basal metabolic rate is 0865 thus her basal metabolic rate is better than expected.  Other Fatigue Penny Horne denies daytime somnolence and denies waking up still tired. Patient has a history of symptoms of morning headache. Penny Horne generally gets 6 or 7 hours of sleep per night, and states that she has generally restful sleep. Snoring is present. Apneic episodes are not present. Epworth Sleepiness Score is 4.   Shortness of Breath Penny Horne notes increasing shortness of breath with exercising and seems to be worsening over time with weight gain. She notes getting out of breath sooner with activity than she used to. This has not gotten worse recently. Penny Horne denies shortness of breath at rest or orthopnea.  Depression Screen Penny Horne's Food and Mood (modified PHQ-9) score was 7.     01/14/2024    7:32 AM  Depression screen PHQ 2/9  Decreased Interest 0  Down, Depressed, Hopeless 0  PHQ - 2 Score 0  Altered sleeping 0  Tired, decreased energy 0  Change in appetite 1  Feeling bad or failure about yourself  0  Trouble concentrating 0  Suicidal thoughts 0  PHQ-9 Score 1  Difficult doing work/chores Not difficult at all     Objective:  No data recorded       01/14/2024    7:00 AM 01/12/2024   12:01 PM 12/09/2023   11:00 AM  Vitals with BMI  Height 5\' 6"   5\' 6"  5\' 6"   Weight 250 lbs 255 lbs 2 oz 244 lbs  BMI 40.37 41.2 39.4  Systolic 104 124 130  Diastolic 67 80 75  Pulse 82 83 64     EKG: Normal sinus rhythm, rate.  General: Cooperative, alert, well developed, in no acute distress. HEENT: Conjunctivae and lids unremarkable. Cardiovascular: Regular rhythm.  Lungs: Normal work of breathing. Neurologic: No focal deficits.   Lab Results  Component Value Date   CREATININE 0.86 10/21/2023   BUN 11 10/21/2023   NA 139 10/21/2023   K 4.0 10/21/2023   CL 105 10/21/2023   CO2 27 10/21/2023   Lab Results   Component Value Date   ALT 24 10/21/2023   AST 22 10/21/2023   ALKPHOS 65 05/21/2021   BILITOT 0.5 10/21/2023   Lab Results  Component Value Date   HGBA1C 5.6 10/21/2023   Lab Results  Component Value Date   INSULIN  7.9 01/14/2024   Lab Results  Component Value Date   TSH 0.816 01/14/2024   Lab Results  Component Value Date   CHOL 199 10/21/2023   HDL 68 10/21/2023   LDLCALC 116 (H) 10/21/2023   TRIG 56 10/21/2023   CHOLHDL 2.9 10/21/2023   Lab Results  Component Value Date   WBC 7.0 10/21/2023   HGB 12.9 10/21/2023   HCT 39.9 10/21/2023   MCV 92.8 10/21/2023   PLT 288 10/21/2023   No results found for: "IRON ", "TIBC", "FERRITIN"  Assessment and Plan:   Other Fatigue  Penny Horne does feel that her weight is causing her energy to be lower than it should be. Fatigue may be related to obesity, depression or many other causes. Labs will be ordered, and in the meanwhile, Penny Horne will focus on self care including making healthy food choices, increasing physical activity and focusing on stress reduction.  Shortness of Breath  Penny Horne does feel that she gets out of breath more easily that she used to when she exercises. Penny Horne's shortness of breath appears to be obesity related and exercise induced. She has agreed to work on weight loss and gradually increase exercise to treat her exercise induced shortness of breath. Will continue to monitor closely.   Problem List Items Addressed This Visit       Other   BMI 40.0-44.9, adult (HCC)   Relevant Medications   lisdexamfetamine (VYVANSE) 30 MG capsule   Hyperlipidemia   The 10-year ASCVD risk score (Arnett DK, et al., 2019) is: 0.2%   Values used to calculate the score:     Age: 43 years     Sex: Female     Is Non-Hispanic African American: Yes     Diabetic: No     Tobacco smoker: No     Systolic Blood Pressure: 104 mmHg     Is BP treated: No     HDL Cholesterol: 68 mg/dL     Total Cholesterol: 199 mg/dL   Labs  done in February.  Will discuss labs at next appointment.  No medication necessary before.      Morbid obesity (HCC)   Anthropometric Measurements Height: 5\' 6"  (1.676 m) Weight: 250 lb (113.4 kg) BMI (Calculated): 40.37 Starting Weight: 250 lb Peak Weight: 250 lb Body Composition  Body Fat %: 46.7 % Fat Mass (lbs): 117.2 lbs Muscle Mass (lbs): 126.8 lbs Total Body Water  (lbs): 95 lbs Visceral Fat Rating : 13 Other Clinical Data RMR: 2002 Fasting: yes Labs: yes Today's Visit #: 1 Starting Date: 01/14/24  Relevant Medications   lisdexamfetamine (VYVANSE) 30 MG capsule   Other Visit Diagnoses       Other fatigue    -  Primary   Relevant Orders   EKG 12-Lead   Vitamin B12 (Completed)   T4, free (Completed)   T3 (Completed)   Folate (Completed)   Insulin , random (Completed)   VITAMIN D  25 Hydroxy (Vit-D Deficiency, Fractures) (Completed)   TSH (Completed)     SOBOE (shortness of breath on exertion)         Depression screening           Penny Horne is currently in the action stage of change and her goal is to continue with weight loss efforts. I recommend Penny Horne begin the structured treatment plan as follows:  She has agreed to Category 3 Plan  Exercise goals: All adults should avoid inactivity. Some activity is better than none, and adults who participate in any amount of physical activity, gain some health benefits.  Behavioral modification strategies:increasing lean protein intake, decreasing simple carbohydrates, increasing vegetables, no skipping meals, meal planning and cooking strategies, and keeping healthy foods in the home  She was informed of the importance of frequent follow-up visits to maximize her success with intensive lifestyle modifications for her multiple health conditions. She was informed we would discuss her lab results at her next visit unless there is a critical issue that needs to be addressed sooner. Penny Horne agreed to keep her next visit  at the agreed upon time to discuss these results.  Labs ordered with plans to discuss at the next visit.   Attestation Statements:  Reviewed by clinician on day of visit: allergies, medications, problem list, medical history, surgical history, family history, social history, and previous encounter notes. This is the patient's first visit at [facility]. The patient's NEW PATIENT PACKET was reviewed at length. Included in the packet: current and past health history, medications, allergies, ROS, gynecologic history (women only), surgical history, family history, social history, weight history, weight loss surgery history (for those that have had weight loss surgery), nutritional evaluation, mood and food questionnaire, PHQ9, Epworth questionnaire, sleep habits questionnaire, patient life and health improvement goals questionnaire. These will all be scanned into the patient's chart under media.   During the visit, I independently reviewed the patient's EKG, bioimpedance scale results, and indirect calorimeter results. I used this information to tailor a meal plan for the patient that will help her to lose weight and will improve her obesity-related conditions going forward. I performed a medically necessary appropriate examination and/or evaluation. I discussed the assessment and treatment plan with the patient. The patient was provided an opportunity to ask questions and all were answered. The patient agreed with the plan and demonstrated an understanding of the instructions. Labs were ordered at this visit and will be reviewed at the next visit unless more critical results need to be addressed immediately. Clinical information was updated and documented in the EMR.     Donaciano Frizzle, MD

## 2024-01-14 NOTE — Assessment & Plan Note (Addendum)
 The 10-year ASCVD risk score (Arnett DK, et al., 2019) is: 0.2%   Values used to calculate the score:     Age: 43 years     Sex: Female     Is Non-Hispanic African American: Yes     Diabetic: No     Tobacco smoker: No     Systolic Blood Pressure: 104 mmHg     Is BP treated: No     HDL Cholesterol: 68 mg/dL     Total Cholesterol: 199 mg/dL   Labs done in February.  Will discuss labs at next appointment.  No medication necessary before.

## 2024-01-16 LAB — TSH: TSH: 0.816 u[IU]/mL (ref 0.450–4.500)

## 2024-01-16 LAB — T4, FREE: Free T4: 1.1 ng/dL (ref 0.82–1.77)

## 2024-01-16 LAB — VITAMIN D 25 HYDROXY (VIT D DEFICIENCY, FRACTURES): Vit D, 25-Hydroxy: 25.6 ng/mL — ABNORMAL LOW (ref 30.0–100.0)

## 2024-01-16 LAB — INSULIN, RANDOM: INSULIN: 7.9 u[IU]/mL (ref 2.6–24.9)

## 2024-01-16 LAB — FOLATE: Folate: 14.9 ng/mL (ref >3.0)

## 2024-01-16 LAB — T3: T3, Total: 138 ng/dL (ref 71–180)

## 2024-01-16 LAB — VITAMIN B12: Vitamin B-12: 537 pg/mL (ref 232–1245)

## 2024-01-25 NOTE — Assessment & Plan Note (Signed)
 Anthropometric Measurements Height: 5\' 6"  (1.676 m) Weight: 250 lb (113.4 kg) BMI (Calculated): 40.37 Starting Weight: 250 lb Peak Weight: 250 lb Body Composition  Body Fat %: 46.7 % Fat Mass (lbs): 117.2 lbs Muscle Mass (lbs): 126.8 lbs Total Body Water  (lbs): 95 lbs Visceral Fat Rating : 13 Other Clinical Data RMR: 2002 Fasting: yes Labs: yes Today's Visit #: 1 Starting Date: 01/14/24

## 2024-01-28 ENCOUNTER — Ambulatory Visit (INDEPENDENT_AMBULATORY_CARE_PROVIDER_SITE_OTHER): Admitting: Family Medicine

## 2024-01-28 ENCOUNTER — Encounter (INDEPENDENT_AMBULATORY_CARE_PROVIDER_SITE_OTHER): Payer: Self-pay | Admitting: Family Medicine

## 2024-01-28 VITALS — BP 113/73 | Temp 98.1°F | Ht 66.0 in | Wt 257.0 lb

## 2024-01-28 DIAGNOSIS — Z6841 Body Mass Index (BMI) 40.0 and over, adult: Secondary | ICD-10-CM

## 2024-01-28 DIAGNOSIS — E785 Hyperlipidemia, unspecified: Secondary | ICD-10-CM | POA: Diagnosis not present

## 2024-01-28 DIAGNOSIS — E559 Vitamin D deficiency, unspecified: Secondary | ICD-10-CM | POA: Diagnosis not present

## 2024-01-28 DIAGNOSIS — E7800 Pure hypercholesterolemia, unspecified: Secondary | ICD-10-CM

## 2024-01-28 MED ORDER — VITAMIN D (ERGOCALCIFEROL) 1.25 MG (50000 UNIT) PO CAPS: 50000.0000 [IU] | ORAL_CAPSULE | ORAL | 0 refills | Status: DC

## 2024-01-28 NOTE — Assessment & Plan Note (Addendum)
 The 10-year ASCVD risk score (Arnett DK, et al., 2019) is: 0.2%   Values used to calculate the score:     Age: 43 years     Sex: Female     Is Non-Hispanic African American: Yes     Diabetic: No     Tobacco smoker: No     Systolic Blood Pressure: 113 mmHg     Is BP treated: No     HDL Cholesterol: 68 mg/dL     Total Cholesterol: 199 mg/dL  Patient and I discussed importance of limiting saturated fat to 20% or less of total intake Repeat FLP in 3 months

## 2024-01-28 NOTE — Assessment & Plan Note (Signed)
 Discussed importance of vitamin d supplementation.  Vitamin d supplementation has been shown to decrease fatigue, decrease risk of progression to insulin resistance and then prediabetes, decreases risk of falling in older age and can even assist in decreasing depressive symptoms in PTSD.   Prescription for Vitamin D sent in.

## 2024-01-28 NOTE — Progress Notes (Signed)
 SUBJECTIVE:  Chief Complaint: Obesity  Interim History: Since last appointment patient was in [place]  for the last weekend and just returned last night.  She ate smaller portions but wasn't following the plan as strictly as she wanted to.  She did increase her protein intake- salmon and chicken.  She did make sure she got vegetables in daily.  She did do some walking in the gym last week.  She picked up her new trailer yesterday.    Penny Horne is here to discuss her progress with her obesity treatment plan. She is on the Category 3 Plan and states she is following her eating plan approximately 85 % of the time. She states she is not exercising, but is walking.   OBJECTIVE: Visit Diagnoses: Problem List Items Addressed This Visit       Other   BMI 40.0-44.9, adult (HCC)   Hyperlipidemia - Primary   The 10-year ASCVD risk score (Arnett DK, et al., 2019) is: 0.2%   Values used to calculate the score:     Age: 43 years     Sex: Female     Is Non-Hispanic African American: Yes     Diabetic: No     Tobacco smoker: No     Systolic Blood Pressure: 113 mmHg     Is BP treated: No     HDL Cholesterol: 68 mg/dL     Total Cholesterol: 199 mg/dL  Patient and I discussed importance of limiting saturated fat to 20% or less of total intake Repeat FLP in 3 months       Morbid obesity (HCC)   Vitamin D  deficiency   Discussed importance of vitamin d  supplementation.  Vitamin d  supplementation has been shown to decrease fatigue, decrease risk of progression to insulin  resistance and then prediabetes, decreases risk of falling in older age and can even assist in decreasing depressive symptoms in PTSD.   Prescription for Vitamin D  sent in.        Relevant Medications   Vitamin D , Ergocalciferol , (DRISDOL) 1.25 MG (50000 UNIT) CAPS capsule    Vitals Temp: 98.1 F (36.7 C) BP: 113/73   Anthropometric Measurements Height: 5\' 6"  (1.676 m) Weight: 257 lb (116.6 kg) BMI (Calculated):  41.5 Weight at Last Visit: 250 lb Weight Lost Since Last Visit: 7 Weight Gained Since Last Visit: 0 Starting Weight: 250 lb Peak Weight: 250 lb   Body Composition  Body Fat %: 48.4 % Fat Mass (lbs): 124.4 lbs Muscle Mass (lbs): 126 lbs Total Body Water  (lbs): 99.4 lbs Visceral Fat Rating : 14   Other Clinical Data Today's Visit #: 2 Starting Date: 01/14/24 Comments: Cat 3     ASSESSMENT AND PLAN:  Diet: Penny Horne is currently in the action stage of change. As such, her goal is to continue with weight loss efforts and has agreed to the Category 3 Plan and the Pescatarian Plan + 300.  Exercise:  For substantial health benefits, adults should do at least 150 minutes (2 hours and 30 minutes) a week of moderate-intensity, or 75 minutes (1 hour and 15 minutes) a week of vigorous-intensity aerobic physical activity, or an equivalent combination of moderate- and vigorous-intensity aerobic activity. Aerobic activity should be performed in episodes of at least 10 minutes, and preferably, it should be spread throughout the week.  Behavior Modification:  We discussed the following Behavioral Modification Strategies today: increasing lean protein intake, decreasing simple carbohydrates, increasing vegetables, meal planning and cooking strategies, and keeping healthy foods in the home.  Return in about 3 weeks (around 02/18/2024).   She was informed of the importance of frequent follow up visits to maximize her success with intensive lifestyle modifications for her multiple health conditions.  Attestation Statements:   Reviewed by clinician on day of visit: allergies, medications, problem list, medical history, surgical history, family history, social history, and previous encounter notes.     Donaciano Frizzle, MD

## 2024-02-12 DIAGNOSIS — Z419 Encounter for procedure for purposes other than remedying health state, unspecified: Secondary | ICD-10-CM | POA: Diagnosis not present

## 2024-03-03 ENCOUNTER — Ambulatory Visit (INDEPENDENT_AMBULATORY_CARE_PROVIDER_SITE_OTHER): Admitting: Family Medicine

## 2024-03-03 ENCOUNTER — Encounter (INDEPENDENT_AMBULATORY_CARE_PROVIDER_SITE_OTHER): Payer: Self-pay | Admitting: Family Medicine

## 2024-03-03 VITALS — BP 106/70 | HR 83 | Temp 98.2°F | Ht 66.0 in | Wt 247.0 lb

## 2024-03-03 DIAGNOSIS — Z6839 Body mass index (BMI) 39.0-39.9, adult: Secondary | ICD-10-CM | POA: Diagnosis not present

## 2024-03-03 DIAGNOSIS — E559 Vitamin D deficiency, unspecified: Secondary | ICD-10-CM

## 2024-03-03 MED ORDER — VITAMIN D (ERGOCALCIFEROL) 1.25 MG (50000 UNIT) PO CAPS: 50000.0000 [IU] | ORAL_CAPSULE | ORAL | 0 refills | Status: DC

## 2024-03-03 NOTE — Assessment & Plan Note (Signed)
 Discussed importance of vitamin d supplementation.  Vitamin d supplementation has been shown to decrease fatigue, decrease risk of progression to insulin resistance and then prediabetes, decreases risk of falling in older age and can even assist in decreasing depressive symptoms in PTSD.   Prescription for Vitamin D sent in.

## 2024-03-03 NOTE — Progress Notes (Signed)
   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  She has been very focused on making sure she is getting some nutrition in. She is really noticing how much she has had to take care of herself.  She has tried a couple of recipes from bariatric cookbook that she has really liked.  She has been walking and in prayer quite a bit.   Penny Horne is here to discuss her progress with her obesity treatment plan. She is on the Category 3 Plan and states she is following her eating plan approximately 80 % of the time. She states she is walking 60 minutes 4-5 times per week.   OBJECTIVE: Visit Diagnoses: Problem List Items Addressed This Visit       Other   Morbid obesity (HCC)   Vitamin D  deficiency - Primary   Relevant Medications   Vitamin D , Ergocalciferol , (DRISDOL ) 1.25 MG (50000 UNIT) CAPS capsule   Other Visit Diagnoses       BMI 39.0-39.9,adult          Assessment & Plan Vitamin D  deficiency Discussed importance of vitamin d  supplementation.  Vitamin d  supplementation has been shown to decrease fatigue, decrease risk of progression to insulin  resistance and then prediabetes, decreases risk of falling in older age and can even assist in decreasing depressive symptoms in PTSD.   Prescription for Vitamin D  sent in.   Morbid obesity (HCC)  BMI 39.0-39.9,adult     Vitals Temp: 98.2 F (36.8 C) BP: 106/70 Pulse Rate: 83 SpO2: 100 %   Anthropometric Measurements Height: 5' 6 (1.676 m) Weight: 247 lb (112 kg) BMI (Calculated): 39.89 Weight at Last Visit: 257 lb Weight Lost Since Last Visit: 10 Weight Gained Since Last Visit: 0 Starting Weight: 250 lb Total Weight Loss (lbs): 3 lb (1.361 kg)   Body Composition  Body Fat %: 45.2 % Fat Mass (lbs): 112 lbs Muscle Mass (lbs): 128.8 lbs Total Body Water  (lbs): 94.6 lbs Visceral  Fat Rating : 12   Other Clinical Data Today's Visit #: 3 Starting Date: 01/14/24 Comments: Cat 3     ASSESSMENT AND PLAN:  Diet: Penny Horne is currently in the action stage of change. As such, her goal is to continue with weight loss efforts and has agreed to the Category 3 Plan.   Exercise:  For substantial health benefits, adults should do at least 150 minutes (2 hours and 30 minutes) a week of moderate-intensity, or 75 minutes (1 hour and 15 minutes) a week of vigorous-intensity aerobic physical activity, or an equivalent combination of moderate- and vigorous-intensity aerobic activity. Aerobic activity should be performed in episodes of at least 10 minutes, and preferably, it should be spread throughout the week.  Behavior Modification:  We discussed the following Behavioral Modification Strategies today: increasing lean protein intake, decreasing simple carbohydrates, increasing vegetables, meal planning and cooking strategies, keeping healthy foods in the home, and planning for success.   Return in about 4 weeks (around 03/31/2024).   She was informed of the importance of frequent follow up visits to maximize her success with intensive lifestyle modifications for her multiple health conditions.  Attestation Statements:   Reviewed by clinician on day of visit: allergies, medications, problem list, medical history, surgical history, family history, social history, and previous encounter notes.     Penny Cho, MD

## 2024-03-08 ENCOUNTER — Ambulatory Visit (INDEPENDENT_AMBULATORY_CARE_PROVIDER_SITE_OTHER): Admitting: Family Medicine

## 2024-03-08 ENCOUNTER — Encounter: Payer: Self-pay | Admitting: Family Medicine

## 2024-03-08 VITALS — BP 126/82 | HR 72 | Temp 98.7°F | Ht 66.0 in | Wt 249.1 lb

## 2024-03-08 DIAGNOSIS — Z6841 Body Mass Index (BMI) 40.0 and over, adult: Secondary | ICD-10-CM

## 2024-03-08 DIAGNOSIS — Z6839 Body mass index (BMI) 39.0-39.9, adult: Secondary | ICD-10-CM

## 2024-03-08 DIAGNOSIS — F4322 Adjustment disorder with anxiety: Secondary | ICD-10-CM

## 2024-03-08 NOTE — Progress Notes (Unsigned)
 Patient Office Visit  Assessment & Plan:  Adjustment disorder with anxiety  BMI 40.0-44.9, adult (HCC)   Assessment and Plan    Weight Management She lost ten pounds through mindful eating and regular physical activity. Goal: weight loss without medication. - Continue weight management program with sessions every three weeks. - Encourage continued mindful eating and regular physical activity.  Anxiety Anxiety well-managed with non-pharmacological methods. - Continue current non-pharmacological management of anxiety.  Care for Mother Her mother is at [facility] with health issues. She advocates for a care plan despite her mother's resistance to staying in a facility. - Discuss with family the need for a comprehensive care plan for her mother.     Recommend healthy diet i.e mediterranean/DASH diet, consistent exercise - 30 minutes 5 day per week, and gradual weight loss. Return in about 3 months (around 06/08/2024), or if symptoms worsen or fail to improve.   Subjective:    Patient ID: Penny Horne, female    DOB: [birth year]  Age: 43 y.o. MRN: [id number]  Chief Complaint  Patient presents with   Medical Management of Chronic Issues    HPI Discussed the use of AI scribe software for clinical note transcription with the patient, who gave verbal consent to proceed.  History of Present Illness        Penny Horne is a 43 year old female who presents for a follow-up visit regarding weight management and lifestyle changes and anxiety  Her weight management program is progressing well, with a ten-pound weight loss since the last visit. She attributes her success to mindful eating habits, allowing occasional indulgences like cupcakes, and avoiding soda in favor of water . She engages in regular physical activity, walking for about an hour, three to four times a week, usually in the evening when it is cooler. Patient's max weight was 280 pounds last year.   Her mood and anxiety  have improved significantly. She is taking her time, avoiding overwhelming herself, and being mindful of her children's observations of her behavior. She practices prayer, which she finds helpful in maintaining her calm. She also engages in mindfulness activities to help with her ongoing stressors.  She reports good sleep and does not feel the need for medication for anxiety or mood at this time.  She discusses her mother's health issues, noting that her mother is currently at [facility] skilled nursing facility in [place] after a hospital visit due to a UTI. Her mother has a history of HIV, which may have progressed to AIDS, but her mother is in denial about her condition. She is unsure about her mother's medication adherence when she is at her house, as her mother refuses some medications.  She has been dealing with stress related to her mother's care and a construction project at her home. She feels frustrated with the builder over unresolved issues and financial disputes, which have led her to start therapy. Despite these challenges, she is determined to hold people accountable and not let these issues affect her mental health. Physical Exam Results Assessment & Plan Weight Management She lost ten pounds through mindful eating and regular physical activity. Goal: weight loss without medication. - Continue weight management program with sessions every three weeks. - Encourage continued mindful eating and regular physical activity.  Anxiety Anxiety well-managed with non-pharmacological methods. - Continue current non-pharmacological management of anxiety.  Care for Mother Her mother is at [facility] with health issues. She advocates for a care plan despite her mother's resistance to  staying in a facility. - Discuss with family the need for a comprehensive care plan for her mother.    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 S/P  SALPINGECTOMY 09-20-2015   Hydrosalpinx    RIGHT   Infertility, female    Pregnancy resulting from in vitro fertilization, third trimester 03/17/2018   Shoulder dystocia during labor and delivery, delivered 03/17/2018   Wears glasses    Past Surgical History:  Procedure Laterality Date   CESAREAN SECTION     CESAREAN SECTION MULTI-GESTATIONAL N/A 05/31/2021   Procedure: CESAREAN SECTION MULTI-GESTATIONAL;  Surgeon: Barbette Knock, MD;  Location: [facility];  Service: Obstetrics;  Laterality: N/A;   LAPAROSCOPY N/A 09/20/2015   Procedure: LAPAROSCOPY DIAGNOSTIC;  Surgeon: Knock Barbette, MD;  Location: [facility];  Service: Gynecology;  Laterality: N/A;   LAPAROSCOPY N/A 03/25/2017   Procedure: LAPAROSCOPY OPERATIVE;  Surgeon: Yalcinkaya, Tamer, MD;  Location: [facility];  Service: Gynecology;  Laterality: N/A;   LYSIS OF ADHESION N/A 03/25/2017   Procedure: LAPAROSCOPY, LYSIS OF ADHESION, BILATERAL SALPINGECTOMY, CHROMOPERTUBATION, ENDOMETRIAL BIOPSY;  Surgeon: Yalcinkaya, Tamer, MD;  Location: [facility];  Service: Gynecology;  Laterality: N/A;   UNILATERAL SALPINGECTOMY Left 09/20/2015   Procedure: UNILATERAL SALPINGECTOMY;  Surgeon: Knock Barbette, MD;  Location: [facility];  Service: Gynecology;  Laterality: Left;   Social History   Tobacco Use   Smoking status: Former    Current packs/day: 0.00    Average packs/day: 0.3 packs/day for 11.7 years (3.0 ttl pk-yrs)    Types: Cigarettes    Start date: 09/05/1995    Quit date: 09/05/2015    Years since quitting: 8.5   Smokeless tobacco: Never   Tobacco comments:    No longer smoke  Vaping Use   Vaping status: Never Used  Substance Use Topics   Alcohol use: Not Currently    Comment: Every blue moon   Drug use: No   Family History   Problem Relation Age of Onset   Thyroid  disease Mother    Hypertension Mother    Transient ischemic attack Mother        Rehab facitiy   HIV Mother    Anxiety disorder Mother    Alcoholism Mother    Hyperlipidemia Father    Diabetes Father        age 75 died   Drug abuse Father    Transient ischemic attack Father    Stroke Father    Drug abuse Sister    Drug abuse Sister    Drug abuse Brother    Drug abuse Brother    Intellectual disability Brother        died 88   Vision loss Paternal Grandmother    Early death Son    Breast cancer Neg Hx    Uterine cancer Neg Hx    Ovarian cancer Neg Hx    Colon cancer Neg Hx    No Known Allergies  ROS    Objective:    BP 126/82   Pulse 72   Temp 98.7 F (37.1 C)   Ht 5' 6 (1.676 m)   Wt 249 lb 2 oz (113 kg)   SpO2 98%   BMI  40.21 kg/m  BP Readings from Last 3 Encounters:  03/08/24 126/82  03/03/24 106/70  01/28/24 113/73  Maximum weight 280 pounds last year Wt Readings from Last 3 Encounters:  03/08/24 249 lb 2 oz (113 kg)  03/03/24 247 lb (112 kg)  01/28/24 257 lb (116.6 kg)    Physical Exam Vitals and nursing note reviewed.  Constitutional:      Appearance: Normal appearance.  HENT:     Head: Normocephalic.     Right Ear: Tympanic membrane, ear canal and external ear normal.     Left Ear: Tympanic membrane, ear canal and external ear normal.  Eyes:     Extraocular Movements: Extraocular movements intact.     Pupils: Pupils are equal, round, and reactive to light.  Cardiovascular:     Rate and Rhythm: Normal rate and regular rhythm.     Heart sounds: Normal heart sounds.  Pulmonary:     Effort: Pulmonary effort is normal.     Breath sounds: Normal breath sounds.  Musculoskeletal:     Right lower leg: No edema.     Left lower leg: No edema.  Neurological:     General: No focal deficit present.     Mental Status: She is alert and oriented to person, place, and time.  Psychiatric:        Mood and  Affect: Mood normal.        Behavior: Behavior normal.        Thought Content: Thought content normal.        Judgment: Judgment normal.      No results found for any visits on 03/08/24.

## 2024-03-09 ENCOUNTER — Encounter: Payer: Self-pay | Admitting: Family Medicine

## 2024-03-13 DIAGNOSIS — Z419 Encounter for procedure for purposes other than remedying health state, unspecified: Secondary | ICD-10-CM | POA: Diagnosis not present

## 2024-04-05 ENCOUNTER — Ambulatory Visit (INDEPENDENT_AMBULATORY_CARE_PROVIDER_SITE_OTHER): Admitting: Family Medicine

## 2024-04-05 ENCOUNTER — Encounter (INDEPENDENT_AMBULATORY_CARE_PROVIDER_SITE_OTHER): Payer: Self-pay | Admitting: Family Medicine

## 2024-04-05 VITALS — BP 112/71 | HR 79 | Temp 97.9°F | Ht 66.0 in | Wt 244.0 lb

## 2024-04-05 DIAGNOSIS — F4322 Adjustment disorder with anxiety: Secondary | ICD-10-CM

## 2024-04-05 DIAGNOSIS — Z6839 Body mass index (BMI) 39.0-39.9, adult: Secondary | ICD-10-CM

## 2024-04-05 NOTE — Progress Notes (Signed)
   SUBJECTIVE:  Chief Complaint: Obesity  Interim History: Patient has been working out 5 times a week and focusing on protein intake.  She has been allowing herself two indulgences in terms of food.  She is eating much more seafood than she was previously.  In terms of quantity of protein she is getting in protein in throughout the day but not necessarily weighing or measuring.  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  Penny Horne is here to discuss her progress with her obesity treatment plan. She is on the Category 3 Plan and states she is following her eating plan approximately 50 % of the time. She states she is exercising 60 minutes 5 times per week.  She was able to follow 50% of the time due to her schedule.   OBJECTIVE: Visit Diagnoses: Problem List Items Addressed This Visit       Other   Adjustment disorder with anxiety - Primary   Other Visit Diagnoses       Morbid obesity (HCC)         BMI 39.0-39.9,adult           Vitals Temp: 97.9 F (36.6 C) BP: 112/71 Pulse Rate: 79 SpO2: 98 %   Anthropometric Measurements Height: 5' 6 (1.676 m) Weight: 244 lb (110.7 kg) BMI (Calculated): 39.4 Weight at Last Visit: 247 lb Weight Lost Since Last Visit: 3 Weight Gained Since Last Visit: 0 Starting Weight: 250 lb Total Weight Loss (lbs): 6 lb (2.722 kg)   Body Composition  Body Fat %: 43.3 % Fat Mass (lbs): 105.8 lbs Muscle Mass (lbs): 131.4 lbs Total Body Water  (lbs): 91.8 lbs Visceral Fat Rating : 12   Other Clinical Data Today's Visit #: 4 Starting Date: 01/14/24 Comments: Cat 3     ASSESSMENT AND PLAN: Assessment & Plan Adjustment disorder with anxiety Previously treated for anxiety.  Patient is currently practicing mindfulness, gratitude, and self soothing techniques to control her symptoms with great results.  Will follow-up at next appointments. Morbid obesity (HCC)  BMI 39.0-39.9,adult    Diet: Scotty  is currently in the action stage of change. As such, her goal is to continue with weight loss efforts and has agreed to the Category 3 Plan.   Exercise:  For substantial health benefits, adults should do at least 150 minutes (2 hours and 30 minutes) a week of moderate-intensity, or 75 minutes (1 hour and 15 minutes) a week of vigorous-intensity aerobic physical activity, or an equivalent combination of moderate- and vigorous-intensity aerobic activity. Aerobic activity should be performed in episodes of at least 10 minutes, and preferably, it should be spread throughout the week.  Behavior Modification:  We discussed the following Behavioral Modification Strategies today: increasing lean protein intake, decreasing simple carbohydrates, increasing vegetables, meal planning and cooking strategies, and planning for success.   Return in about 3 weeks (around 04/26/2024).   She was informed of the importance of frequent follow up visits to maximize her success with intensive lifestyle modifications for her multiple health conditions.  Attestation Statements:   Reviewed by clinician on day of visit: allergies, medications, problem list, medical history, surgical history, family history, social history, and previous encounter notes.    Adelita Cho, MD

## 2024-04-05 NOTE — Assessment & Plan Note (Signed)
 Previously treated for anxiety.  Patient is currently practicing mindfulness, gratitude, and self soothing techniques to control her symptoms with great results.  Will follow-up at next appointments.

## 2024-04-13 DIAGNOSIS — Z419 Encounter for procedure for purposes other than remedying health state, unspecified: Secondary | ICD-10-CM | POA: Diagnosis not present

## 2024-04-27 ENCOUNTER — Ambulatory Visit (INDEPENDENT_AMBULATORY_CARE_PROVIDER_SITE_OTHER): Admitting: Family Medicine

## 2024-04-29 ENCOUNTER — Ambulatory Visit (INDEPENDENT_AMBULATORY_CARE_PROVIDER_SITE_OTHER): Admitting: Family Medicine

## 2024-04-29 ENCOUNTER — Encounter (INDEPENDENT_AMBULATORY_CARE_PROVIDER_SITE_OTHER): Payer: Self-pay | Admitting: Family Medicine

## 2024-04-29 DIAGNOSIS — E559 Vitamin D deficiency, unspecified: Secondary | ICD-10-CM | POA: Diagnosis not present

## 2024-04-29 DIAGNOSIS — Z6841 Body Mass Index (BMI) 40.0 and over, adult: Secondary | ICD-10-CM | POA: Diagnosis not present

## 2024-04-29 DIAGNOSIS — E7800 Pure hypercholesterolemia, unspecified: Secondary | ICD-10-CM

## 2024-04-29 MED ORDER — VITAMIN D (ERGOCALCIFEROL) 1.25 MG (50000 UNIT) PO CAPS: 50000.0000 [IU] | ORAL_CAPSULE | ORAL | 0 refills | Status: DC

## 2024-04-29 NOTE — Progress Notes (Signed)
   SUBJECTIVE:  Chief Complaint: Obesity  Interim History: Patient has been stressed and eating out more frequently due to that.  She is also bloated in relation to her cycle.  She is walking only 2-3 times a week instead of 4-5 times.  Financially and mentally she is exhausted.  Her go to in terms of coping mechanisms are prayer.  She is an emotional non eater and then tends to over indulge when she finally does eat.  She is planning to work this weekend for Labor Day weekend.  Penny Horne is here to discuss her progress with her obesity treatment plan. She is on the Category 3 Plan and states she is following her eating plan approximately 50 % of the time. She states she is walking 60 minutes 2-3 times per week.   OBJECTIVE: Visit Diagnoses: Problem List Items Addressed This Visit   None   No data recorded No data recorded No data recorded No data recorded   ASSESSMENT AND PLAN: Assessment & Plan Vitamin D  deficiency On prescription strength Vitamin D  with no nausea, vomiting or muscle weakness.  Needs a refill of this today. Pure hypercholesterolemia Initial LDL of 116, HDL of 68 and Triglycerides of 56.  She is working on mindful eating and limiting saturated fat intake to less than 20% of total intake daily.  Will repeat labs at next appointment. Morbid obesity (HCC)  BMI 40.0-44.9, adult (HCC)    Diet: Penny Horne is currently in the action stage of change. As such, her goal is to continue with weight loss efforts and has agreed to the Category 3 Plan.   Exercise:  For substantial health benefits, adults should do at least 150 minutes (2 hours and 30 minutes) a week of moderate-intensity, or 75 minutes (1 hour and 15 minutes) a week of vigorous-intensity aerobic physical activity, or an equivalent combination of moderate- and vigorous-intensity aerobic activity. Aerobic activity should be performed in episodes of at least 10 minutes, and preferably, it should be spread throughout  the week.  Behavior Modification:  We discussed the following Behavioral Modification Strategies today: increasing lean protein intake, decreasing simple carbohydrates, increasing vegetables, meal planning and cooking strategies, and planning for success.   No follow-ups on file.   She was informed of the importance of frequent follow up visits to maximize her success with intensive lifestyle modifications for her multiple health conditions.  Attestation Statements:   Reviewed by clinician on day of visit: allergies, medications, problem list, medical history, surgical history, family history, social history, and previous encounter notes.    Penny Cho, MD

## 2024-05-08 NOTE — Assessment & Plan Note (Signed)
 Initial LDL of 116, HDL of 68 and Triglycerides of 56.  She is working on mindful eating and limiting saturated fat intake to less than 20% of total intake daily.  Will repeat labs at next appointment.

## 2024-05-08 NOTE — Assessment & Plan Note (Signed)
 On prescription strength Vitamin D  with no nausea, vomiting or muscle weakness.  Needs a refill of this today.

## 2024-05-14 DIAGNOSIS — Z419 Encounter for procedure for purposes other than remedying health state, unspecified: Secondary | ICD-10-CM | POA: Diagnosis not present

## 2024-05-27 ENCOUNTER — Ambulatory Visit (INDEPENDENT_AMBULATORY_CARE_PROVIDER_SITE_OTHER): Admitting: Family Medicine

## 2024-06-08 ENCOUNTER — Ambulatory Visit: Admitting: Family Medicine

## 2024-06-15 ENCOUNTER — Ambulatory Visit (INDEPENDENT_AMBULATORY_CARE_PROVIDER_SITE_OTHER): Admitting: Physician Assistant

## 2024-06-15 NOTE — Progress Notes (Deleted)
   SUBJECTIVE: Discussed the use of AI scribe software for clinical note transcription with the patient, who gave verbal consent to proceed.  Chief Complaint: Obesity  Interim History: ***   Last in OV with Dr. Berkeley 04/29/24 Penny Horne is here to discuss her progress with her obesity treatment plan. She is on the Category 3 Plan and states she {CHL AMB IS/IS NOT:210130109} following her eating plan approximately *** % of the time. She states she {CHL AMB IS/IS NOT:210130109} exercising *** minutes *** times per week.   OBJECTIVE: Visit Diagnoses: Problem List Items Addressed This Visit     Hyperlipidemia - Primary   Vitamin D  deficiency   Other Visit Diagnoses       Morbid obesity (HCC)           No data recorded No data recorded No data recorded No data recorded   ASSESSMENT AND PLAN:  Diet: Penny Horne {CHL AMB IS/IS NOT:210130109} currently in the action stage of change. As such, her goal is to {HWW Weight Loss Efforts:210964006}. She {HAS HAS WNU:81165} agreed to {HWW Weight Loss Plan:210964005}.  Exercise: Penny Horne has been instructed {HWW Exercise:210964007} for weight loss and overall health benefits.   Behavior Modification:  We discussed the following Behavioral Modification Strategies today: {HWW Behavior Modification:210964008}. We discussed various medication options to help Penny Horne with her weight loss efforts and we both agreed to ***.  No follow-ups on file.SABRA She was informed of the importance of frequent follow up visits to maximize her success with intensive lifestyle modifications for her multiple health conditions.  Attestation Statements:   Reviewed by clinician on day of visit: allergies, medications, problem list, medical history, surgical history, family history, social history, and previous encounter notes.   Time spent on visit including pre-visit chart review and post-visit care and charting was *** minutes.    Ignatius Kloos, PA-C

## 2024-07-05 ENCOUNTER — Ambulatory Visit (INDEPENDENT_AMBULATORY_CARE_PROVIDER_SITE_OTHER): Admitting: Family Medicine

## 2024-07-05 ENCOUNTER — Encounter: Payer: Self-pay | Admitting: Family Medicine

## 2024-07-05 VITALS — BP 130/88 | Temp 98.7°F | Ht 66.0 in | Wt 258.0 lb

## 2024-07-05 DIAGNOSIS — N926 Irregular menstruation, unspecified: Secondary | ICD-10-CM

## 2024-07-05 DIAGNOSIS — K581 Irritable bowel syndrome with constipation: Secondary | ICD-10-CM | POA: Diagnosis not present

## 2024-07-05 DIAGNOSIS — R61 Generalized hyperhidrosis: Secondary | ICD-10-CM

## 2024-07-05 DIAGNOSIS — K5900 Constipation, unspecified: Secondary | ICD-10-CM

## 2024-07-05 LAB — PREGNANCY, URINE: Preg Test, Ur: NEGATIVE

## 2024-07-05 NOTE — Progress Notes (Signed)
 Patient Office Visit  Assessment & Plan:  Irregular menses -     Follicle stimulating hormone -     Pregnancy, urine  Constipation, unspecified constipation type  Night sweats  Irritable bowel syndrome with constipation   Assessment and Plan    Irregular menstruation Irregular menstruation with negative pregnancy test. Possible perimenopause due to age, though slightly young. Stress levels decreased, regular exercise resumed. Normal thyroid  function, no fibroids or endometriosis history. - Order FSH test to evaluate for perimenopause.  Irritable bowel syndrome with constipation Symptoms consistent with IBS with constipation. Stress and dietary changes may contribute. - Provided information on IBS and dietary management. - Advised gradual increase in dietary fiber intake. - Consider probiotics for symptom management. - Offer Bentyl for cramping if lifestyle changes are insufficient. - Refer to gastroenterologist if symptoms do not improve with lifestyle changes.          No follow-ups on file.   Subjective:    Patient ID: Penny Horne, female    DOB: 1981-01-22  Age: 43 y.o. MRN: [id number]  Chief Complaint  Patient presents with   Menstrual Problem    Pt had 2 periods for 2 months in a row. Pt states she feels like something is off    HPI Discussed the use of AI scribe software for clinical note transcription with the patient, who gave verbal consent to proceed.  History of Present Illness     History of Present Illness Penny Horne is a 43 year old female who presents with irregular menstrual cycles and bloating.  She has been experiencing irregular menstrual cycles, with two periods occurring in both September and October. The periods are not heavy, but the blood appears darker than usual. Each period lasts about five to seven days, without clots or significant cramping. She experiences night sweats when her period starts.  She describes significant  bloating, feeling fine one day and waking up bloated the next. She also experiences constipation, a change from her usual bowel habits. She uses an over-the-counter product from [employer] to help with bowel movements, which allows her to go daily. Without it, she does not have regular bowel movements.  She mentions a dull cramping on her left side, consistent and accompanied by a sensation of weight on her hips and lower back. No history of fibroids. She reports that her thyroid  was previously checked. She has not experienced severe menstrual cramps historically.  She has a history of taking birth control pills for about three months last year, which she stopped due to her husband's preference. She did not notice any changes in her menstrual cycle immediately after stopping the pills, but the irregularities began recently. She is not currently using any form of birth control and has not taken a pregnancy test yet.  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 She reports feeling 'super gassy' and experiencing cramping that is not menstrual in nature. No family history of ovarian issues.  Physical Exam ABDOMEN: Abdomen normal, no signs of appendicitis.  Results LABS Pregnancy test: Negative (07/05/2024)  Assessment and Plan Irregular menstruation Irregular menstruation with negative pregnancy test. Possible perimenopause due to age, though slightly young. Stress levels decreased, regular exercise resumed. Normal thyroid  function, no fibroids or endometriosis history. - Order FSH test to evaluate for perimenopause.  Irritable bowel syndrome with constipation Symptoms consistent with IBS with constipation. Stress and  dietary changes may contribute. - Provided information on IBS and dietary management. - Advised gradual increase in dietary fiber intake. - Consider probiotics for symptom  management. - Offer Bentyl for cramping if lifestyle changes are insufficient. - Refer to gastroenterologist if symptoms do not improve with lifestyle changes. 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 S/P  SALPINGECTOMY 09-20-2015   Hydrosalpinx    RIGHT   Infertility, female    Pregnancy resulting from in vitro fertilization, third trimester 03/17/2018   Shoulder dystocia during labor and delivery, delivered 03/17/2018   Wears glasses    Past Surgical History:  Procedure Laterality Date   CESAREAN SECTION     CESAREAN SECTION MULTI-GESTATIONAL N/A 05/31/2021   Procedure: CESAREAN SECTION MULTI-GESTATIONAL;  Surgeon: Barbette Knock, MD;  Location: [facility];  Service: Obstetrics;  Laterality: N/A;   LAPAROSCOPY N/A 09/20/2015   Procedure: LAPAROSCOPY DIAGNOSTIC;  Surgeon: Knock Barbette, MD;  Location: [facility];  Service: Gynecology;  Laterality: N/A;   LAPAROSCOPY N/A 03/25/2017   Procedure: LAPAROSCOPY OPERATIVE;  Surgeon: Yalcinkaya, Tamer, MD;  Location: [facility];  Service: Gynecology;  Laterality: N/A;   LYSIS OF ADHESION N/A 03/25/2017   Procedure: LAPAROSCOPY, LYSIS OF ADHESION, BILATERAL SALPINGECTOMY, CHROMOPERTUBATION, ENDOMETRIAL BIOPSY;  Surgeon: Yalcinkaya, Tamer, MD;  Location: [facility];  Service: Gynecology;  Laterality: N/A;   UNILATERAL SALPINGECTOMY Left 09/20/2015   Procedure: UNILATERAL SALPINGECTOMY;  Surgeon: Knock Barbette, MD;  Location: [facility];  Service: Gynecology;  Laterality: Left;   Social History   Tobacco Use   Smoking status: Former    Current packs/day: 0.00    Average packs/day: 0.3 packs/day for 11.7 years (3.0 ttl pk-yrs)    Types: Cigarettes    Start date: 09/05/1995    Quit  date: 09/05/2015    Years since quitting: 8.8   Smokeless tobacco: Never   Tobacco comments:    No longer smoke  Vaping Use   Vaping status: Never Used  Substance Use Topics   Alcohol use: Not Currently    Comment: Every blue moon   Drug use: No   Family History  Problem Relation Age of Onset   Thyroid  disease Mother    Hypertension Mother    Transient ischemic attack Mother        Rehab facitiy   HIV Mother    Anxiety disorder Mother    Alcoholism Mother    Hyperlipidemia Father    Diabetes Father        age 17 died   Drug abuse Father    Transient ischemic attack Father    Stroke Father    Drug abuse Sister    Drug abuse Sister    Drug abuse Brother    Drug abuse Brother    Intellectual disability Brother        died 75   Vision loss Paternal Grandmother    Early death Son    Breast cancer Neg Hx    Uterine cancer Neg Hx    Ovarian cancer Neg Hx    Colon cancer Neg Hx    No Known Allergies  ROS    Objective:    BP 130/88   Temp 98.7 F (37.1 C)  Ht 5' 6 (1.676 m)   Wt 258 lb (117 kg)   LMP 06/21/2024 Comment: October 2nd and October 20th LMP  BMI 41.64 kg/m  BP Readings from Last 3 Encounters:  07/05/24 130/88  04/29/24 114/73  04/05/24 112/71   Wt Readings from Last 3 Encounters:  07/05/24 258 lb (117 kg)  04/29/24 248 lb (112.5 kg)  04/05/24 244 lb (110.7 kg)    Physical Exam Vitals and nursing note reviewed.  Constitutional:      Appearance: Normal appearance.  HENT:     Head: Normocephalic.     Right Ear: Tympanic membrane, ear canal and external ear normal.     Left Ear: Tympanic membrane, ear canal and external ear normal.  Eyes:     Extraocular Movements: Extraocular movements intact.     Conjunctiva/sclera: Conjunctivae normal.     Pupils: Pupils are equal, round, and reactive to light.  Cardiovascular:     Rate and Rhythm: Normal rate and regular rhythm.     Heart sounds: Normal heart sounds.  Pulmonary:     Effort:  Pulmonary effort is normal.     Breath sounds: Normal breath sounds.  Musculoskeletal:     Right lower leg: No edema.     Left lower leg: No edema.  Neurological:     General: No focal deficit present.     Mental Status: She is alert and oriented to person, place, and time.  Psychiatric:        Mood and Affect: Mood normal.        Behavior: Behavior normal.        Thought Content: Thought content normal.        Judgment: Judgment normal.      Results for orders placed or performed in visit on 07/05/24  Pregnancy, urine  Result Value Ref Range   Preg Test, Ur NEGATIVE NEGATIVE

## 2024-07-06 ENCOUNTER — Ambulatory Visit: Payer: Self-pay | Admitting: Family Medicine

## 2024-07-06 LAB — FOLLICLE STIMULATING HORMONE: FSH: 4.5 m[IU]/mL

## 2024-07-14 DIAGNOSIS — Z419 Encounter for procedure for purposes other than remedying health state, unspecified: Secondary | ICD-10-CM | POA: Diagnosis not present

## 2024-08-02 ENCOUNTER — Ambulatory Visit: Admitting: Family Medicine

## 2024-08-02 ENCOUNTER — Encounter: Payer: Self-pay | Admitting: Family Medicine

## 2024-08-02 VITALS — BP 122/82 | HR 82 | Temp 98.5°F | Ht 66.0 in | Wt 258.6 lb

## 2024-08-02 DIAGNOSIS — Z1231 Encounter for screening mammogram for malignant neoplasm of breast: Secondary | ICD-10-CM | POA: Diagnosis not present

## 2024-08-02 DIAGNOSIS — Z6841 Body Mass Index (BMI) 40.0 and over, adult: Secondary | ICD-10-CM | POA: Diagnosis not present

## 2024-08-02 MED ORDER — PHENTERMINE-TOPIRAMATE ER 3.75-23 MG PO CP24: 1.0000 | ORAL_CAPSULE | Freq: Every day | ORAL | 0 refills | Status: DC

## 2024-08-02 NOTE — Progress Notes (Signed)
 Patient Office Visit  Assessment & Plan:  BMI 40.0-44.9, adult (HCC) -     Phentermine-Topiramate ER; Take 1 capsule by mouth daily.  Dispense: 30 capsule; Refill: 0  Screening mammogram for breast cancer -     3D Screening Mammogram, Left and Right; Future   Assessment and Plan    Obesity Previous phentermine use effective but unsuitable long-term. Qsymia covered by insurance, may have fewer side effects. Patient aware of lifestyle modifications. - Prescribed Qsymia at low dose, consider increase after one month if tolerated. - Follow-up in one month to assess response and adjust dosage. - Encouraged regular exercise and healthy eating.  General Health Maintenance Due for mammogram, last done last October. - Ordered mammogram at [facility] in [place].      Return in about 1 month (around 09/03/2024), or if symptoms worsen or fail to improve.   Subjective:    Patient ID: Penny Horne, female    DOB: Dec 31, 1980  Age: 43 y.o. MRN: [id number]  Chief Complaint  Patient presents with   Medical Management of Chronic Issues    HPI Discussed the use of AI scribe software for clinical note transcription with the patient, who gave verbal consent to proceed.  History of Present Illness     History of Present Illness Penny Horne is a 43 year old female who presents for weight management and medication review.  She has been struggling with weight management and previously attended a weight management program for a few months without benefit. She was prescribed phentermine, which was effective in helping her lose weight and provided her with energy to exercise regularly. She lost approximately 40 pounds while on phentermine, exercising regularly and maintaining her diet.  She is exploring other medication options for weight management. Her insurance covered phentermine previously, and she is inquiring about coverage for other medications. She is interested in Qsymia, which  she understands contains a lower dose of phentermine combined with Topamax, and is curious about its coverage and potential side effects.  She experiences back and knee pain, which she attributes to her weight. Her weight impacts her ability to engage in physical activities, such as going to the gym and walking, which she enjoys for stress relief.  She has not had a mammogram since last October and is seeking to schedule one as it is time for her annual screening. She previously had a mammogram at another office but is looking for a more affordable option.  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 She has grandchildren, aged one and nearly three, who often have runny noses, and she occasionally experiences laryngitis, which she attributes to being around them.  Assessment and Plan Obesity Previous phentermine use effective but unsuitable long-term. Qsymia covered by insurance, may have fewer side effects. Patient aware of lifestyle modifications.  Her insurance will not cover Wegovy or Zepbound. - Prescribed Qsymia at low dose, consider increase after one month if tolerated. - Follow-up in one month to assess response and adjust dosage. - Encouraged regular exercise and healthy eating.  General Health Maintenance Due for mammogram, last done last October. - Ordered mammogram at [facility] in [place].  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 S/P  SALPINGECTOMY 09-20-2015   Hydrosalpinx    RIGHT   Infertility, female    Pregnancy resulting from in vitro fertilization, third trimester 03/17/2018   Shoulder dystocia during labor and delivery, delivered 03/17/2018   Wears glasses    Past Surgical  History:  Procedure Laterality Date   CESAREAN SECTION     CESAREAN SECTION MULTI-GESTATIONAL N/A 05/31/2021   Procedure: CESAREAN SECTION MULTI-GESTATIONAL;  Surgeon: Barbette Knock, MD;  Location: [facility];  Service: Obstetrics;  Laterality: N/A;   LAPAROSCOPY N/A 09/20/2015   Procedure: LAPAROSCOPY DIAGNOSTIC;  Surgeon: Knock Barbette, MD;  Location: [facility];  Service: Gynecology;  Laterality: N/A;   LAPAROSCOPY N/A 03/25/2017   Procedure: LAPAROSCOPY OPERATIVE;  Surgeon: Yalcinkaya, Tamer, MD;  Location: [facility];  Service: Gynecology;  Laterality: N/A;   LYSIS OF ADHESION N/A 03/25/2017   Procedure: LAPAROSCOPY, LYSIS OF ADHESION, BILATERAL SALPINGECTOMY, CHROMOPERTUBATION, ENDOMETRIAL BIOPSY;  Surgeon: Yalcinkaya, Tamer, MD;  Location: [facility];  Service: Gynecology;  Laterality: N/A;   UNILATERAL SALPINGECTOMY Left 09/20/2015   Procedure: UNILATERAL SALPINGECTOMY;  Surgeon: Knock Barbette, MD;  Location: [facility];  Service: Gynecology;  Laterality: Left;   Social History   Tobacco Use   Smoking status: Former    Current packs/day: 0.00    Average packs/day: 0.3 packs/day for 11.7 years (3.0 ttl pk-yrs)    Types: Cigarettes    Start date: 09/05/1995    Quit date: 09/05/2015    Years since quitting: 8.9   Smokeless tobacco: Never   Tobacco comments:    No longer smoke  Vaping Use   Vaping status: Never Used  Substance Use Topics   Alcohol use: Not Currently    Comment: Every blue moon   Drug use: No   Family History  Problem Relation Age of Onset   Thyroid  disease Mother    Hypertension Mother    Transient ischemic attack Mother        Rehab facitiy   HIV Mother    Anxiety disorder Mother    Alcoholism Mother    Hyperlipidemia Father    Diabetes Father        age 61 died   Drug abuse Father    Transient ischemic attack Father    Stroke Father    Drug abuse Sister    Drug abuse Sister    Drug abuse Brother    Drug abuse Brother     Intellectual disability Brother        died 52   Vision loss Paternal Grandmother    Early death Son    Breast cancer Neg Hx    Uterine cancer Neg Hx    Ovarian cancer Neg Hx    Colon cancer Neg Hx    No Known Allergies  ROS    Objective:    BP 122/82   Pulse 82   Temp 98.5 F (36.9 C)   Ht 5' 6 (1.676 m)   Wt 258 lb 9 oz (117.3 kg)   LMP 06/21/2024 Comment: October 2nd and October 20th LMP  SpO2 99%   BMI 41.73 kg/m  BP Readings from Last 3 Encounters:  08/02/24 122/82  07/05/24 130/88  04/29/24 114/73   Wt Readings from Last 3 Encounters:  08/02/24 258 lb 9 oz (117.3 kg)  07/05/24 258 lb (117 kg)  04/29/24 248 lb (112.5 kg)    Physical Exam Vitals and nursing note reviewed.  Constitutional:  Appearance: Normal appearance.  HENT:     Head: Normocephalic.     Right Ear: Tympanic membrane, ear canal and external ear normal.     Left Ear: Tympanic membrane, ear canal and external ear normal.  Eyes:     Extraocular Movements: Extraocular movements intact.     Conjunctiva/sclera: Conjunctivae normal.     Pupils: Pupils are equal, round, and reactive to light.  Cardiovascular:     Rate and Rhythm: Normal rate and regular rhythm.     Heart sounds: Normal heart sounds.  Pulmonary:     Effort: Pulmonary effort is normal.     Breath sounds: Normal breath sounds.  Musculoskeletal:     Right lower leg: No edema.     Left lower leg: No edema.  Neurological:     General: No focal deficit present.     Mental Status: She is alert and oriented to person, place, and time.  Psychiatric:        Mood and Affect: Mood normal.        Behavior: Behavior normal.        Thought Content: Thought content normal.        Judgment: Judgment normal.      No results found for any visits on 08/02/24.

## 2024-08-03 ENCOUNTER — Other Ambulatory Visit (HOSPITAL_COMMUNITY): Payer: Self-pay

## 2024-08-03 ENCOUNTER — Telehealth: Payer: Self-pay | Admitting: Pharmacy Technician

## 2024-08-03 ENCOUNTER — Other Ambulatory Visit: Payer: Self-pay | Admitting: Family Medicine

## 2024-08-03 MED ORDER — PHENTERMINE HCL 15 MG PO CAPS: 15.0000 mg | ORAL_CAPSULE | ORAL | 0 refills | Status: DC

## 2024-08-03 NOTE — Telephone Encounter (Signed)
 Pharmacy Patient Advocate Encounter   Received notification from Onbase that prior authorization for Phentermine-Topiramate ER 3.75-23MG  er capsules is required/requested.   Insurance verification completed.   The patient is insured through [facility] MEDICAID.   Per test claim: PA required; PA started via CoverMyMeds. KEY [id number] . Waiting for clinical questions to populate.

## 2024-08-03 NOTE — Telephone Encounter (Signed)
 Pharmacy Patient Advocate Encounter  Received notification from [facility] MEDICAID that Prior Authorization for Phentermine-Topiramate ER 3.75-23MG  er capsules has been DENIED.  Full denial letter will be uploaded to the media tab. See denial reason below.    PA #/Case ID/Reference #: [id number]

## 2024-08-03 NOTE — Progress Notes (Signed)
 Insurance will approve phentermine only for weight loss (not Qsymia)

## 2024-08-04 ENCOUNTER — Other Ambulatory Visit (HOSPITAL_COMMUNITY): Payer: Self-pay

## 2024-08-31 ENCOUNTER — Ambulatory Visit
Admission: RE | Admit: 2024-08-31 | Discharge: 2024-08-31 | Disposition: A | Discharge status: Home / Self Care | Source: Ambulatory Visit | Attending: Family Medicine | Admitting: Family Medicine

## 2024-08-31 DIAGNOSIS — Z1231 Encounter for screening mammogram for malignant neoplasm of breast: Secondary | ICD-10-CM

## 2024-09-03 ENCOUNTER — Other Ambulatory Visit: Payer: Self-pay | Admitting: Family Medicine

## 2024-09-03 ENCOUNTER — Other Ambulatory Visit: Payer: Self-pay

## 2024-09-03 MED ORDER — PHENTERMINE HCL 30 MG PO CAPS: 30.0000 mg | ORAL_CAPSULE | ORAL | 1 refills | Status: AC

## 2024-09-06 ENCOUNTER — Ambulatory Visit: Admitting: Family Medicine

## 2024-09-07 ENCOUNTER — Encounter: Payer: Self-pay | Admitting: Family Medicine

## 2024-09-07 ENCOUNTER — Ambulatory Visit (INDEPENDENT_AMBULATORY_CARE_PROVIDER_SITE_OTHER): Admitting: Family Medicine

## 2024-09-07 VITALS — BP 126/82 | HR 65 | Ht 66.0 in | Wt 262.8 lb

## 2024-09-07 DIAGNOSIS — E66813 Obesity, class 3: Secondary | ICD-10-CM | POA: Diagnosis not present

## 2024-09-07 DIAGNOSIS — E559 Vitamin D deficiency, unspecified: Secondary | ICD-10-CM | POA: Diagnosis not present

## 2024-09-07 DIAGNOSIS — Z6841 Body Mass Index (BMI) 40.0 and over, adult: Secondary | ICD-10-CM | POA: Diagnosis not present

## 2024-09-07 MED ORDER — VITAMIN D (ERGOCALCIFEROL) 1.25 MG (50000 UNIT) PO CAPS: 50000.0000 [IU] | ORAL_CAPSULE | ORAL | 0 refills | Status: AC

## 2024-09-07 NOTE — Progress Notes (Signed)
 "  Patient Office Visit  Assessment & Plan:  BMI 40.0-44.9, adult (HCC)  Vitamin D  deficiency -     Vitamin D  (Ergocalciferol ); Take 1 capsule (50,000 Units total) by mouth every 7 (seven) days.  Dispense: 12 capsule; Refill: 0   Assessment and Plan    Morbid obesity Managed with medication and lifestyle modifications. Recent medication dosage increase due to lack of weight loss progress. Emphasized diet and exercise to maintain weight loss and prevent rebound. Discussed potential for rebound weight gain and depression if medication is discontinued without lifestyle changes. She is motivated to continue weight loss efforts. - Continue current medication regimen. - Encouraged regular exercise and healthy eating habits. - Scheduled follow-up in two months to monitor progress.  General Health Maintenance Mammogram completed, results good. Declined flu vaccination. - Continue to decline flu vaccination.      Recommend healthy diet i.e mediterranean/DASH diet, consistent exercise - 30 minutes 5 day per week, and gradual weight loss. Patient declined flu shot  Return in about 2 months (around 11/05/2024), or if symptoms worsen or fail to improve.   Subjective:    Patient ID: Penny Horne, female    DOB: 16-Jun-1981  Age: 44 y.o. MRN: [id number]  Chief Complaint  Patient presents with   Medical Management of Chronic Issues    Here for f/u on phentermine .     HPI Discussed the use of AI scribe software for clinical note transcription with the patient, who gave verbal consent to proceed.  History of Present Illness        History of Present Illness Penny Horne is a 44 year old female who presents for follow-up regarding weight management and medication adjustment.  She has been taking her weight management medication daily and recently requested an increase in dosage due to increased snacking at night, which she attributes to insufficient daytime eating, especially during the  busy holiday season when she was unable to visit the gym regularly. She has resumed her gym routine and is focusing on maintaining a balanced diet.  She reports a history of significant weight loss, having lost thirty pounds after starting the medication. She has maintained this weight loss, although she notes a recent increase of four pounds, bringing her current weight to 262 pounds. She attributes this fluctuation to her busy schedule and not eating enough rather than overeating.  Her dietary habits include eating smaller portions multiple times a day and incorporating foods like oatmeal and salads. She engages in regular physical activity, including gym workouts, walking, and yoga, which she finds beneficial for her mood and overall well-being.  She mentions that her blood pressure is good and she is not experiencing any side effects from the medication, except for increased energy, which she describes as 'fake'. She emphasizes the importance of diet and exercise to maintain momentum once the medication is discontinued.  She completed a mammogram on December 30th at the [facility], although she has not yet received the results. She declines the flu shot, stating she has been fine without it in previous years.  No negative side effects from her medication. Mood is great. Blood pressure is good and she has increased energy.  Physical Exam MEASUREMENTS: Weight- 262.  Assessment and Plan Morbid obesity Managed with medication and lifestyle modifications. Recent medication dosage increase due to lack of weight loss progress. Emphasized diet and exercise to maintain weight loss and prevent rebound. Discussed potential for rebound weight gain and depression if medication is discontinued  without lifestyle changes. She is motivated to continue weight loss efforts.  Her insurance would not cover Qsymia  or GLP-1's. - Continue current medication regimen. - Encouraged regular exercise and healthy eating  habits. - Scheduled follow-up in two months to monitor progress.  General Health Maintenance Mammogram completed, results good. Declined flu vaccination. - Continue to decline flu vaccination.   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 S/P  SALPINGECTOMY 09-20-2015   Hydrosalpinx    RIGHT   Infertility, female    Pregnancy resulting from in vitro fertilization, third trimester 03/17/2018   Shoulder dystocia during labor and delivery, delivered 03/17/2018   Wears glasses    Past Surgical History:  Procedure Laterality Date   CESAREAN SECTION     CESAREAN SECTION MULTI-GESTATIONAL N/A 05/31/2021   Procedure: CESAREAN SECTION MULTI-GESTATIONAL;  Surgeon: Barbette Knock, MD;  Location: [facility];  Service: Obstetrics;  Laterality: N/A;   LAPAROSCOPY N/A 09/20/2015   Procedure: LAPAROSCOPY DIAGNOSTIC;  Surgeon: Knock Barbette, MD;  Location: [facility];  Service: Gynecology;  Laterality: N/A;   LAPAROSCOPY N/A 03/25/2017   Procedure: LAPAROSCOPY OPERATIVE;  Surgeon: Yalcinkaya, Tamer, MD;  Location: [facility];  Service: Gynecology;  Laterality: N/A;   LYSIS OF ADHESION N/A 03/25/2017   Procedure: LAPAROSCOPY, LYSIS OF ADHESION, BILATERAL SALPINGECTOMY, CHROMOPERTUBATION, ENDOMETRIAL BIOPSY;  Surgeon: Yalcinkaya, Tamer, MD;  Location: [facility];  Service: Gynecology;  Laterality: N/A;   UNILATERAL SALPINGECTOMY Left 09/20/2015   Procedure: UNILATERAL SALPINGECTOMY;  Surgeon: Knock Barbette, MD;  Location: [facility];  Service: Gynecology;  Laterality: Left;   Social History[1] Family History  Problem Relation Age of Onset   Thyroid  disease Mother    Hypertension Mother    Transient ischemic attack Mother        Rehab facitiy    HIV Mother    Anxiety disorder Mother    Alcoholism Mother    Hyperlipidemia Father    Diabetes Father        age 32 died   Drug abuse Father    Transient ischemic attack Father    Stroke Father    Drug abuse Sister    Drug abuse Sister    Drug abuse Brother    Drug abuse Brother    Intellectual disability Brother        died 45   Vision loss Paternal Grandmother    Early death Son    Breast cancer Neg Hx    Uterine cancer Neg Hx    Ovarian cancer Neg Hx    Colon cancer Neg Hx    Allergies[2]  ROS    Objective:    BP 126/82 (BP Location: Left Arm, Patient Position: Sitting, Cuff Size: Normal)   Pulse 65   Ht 5' 6 (1.676 m)   Wt 262 lb 12.8 oz (119.2 kg)   LMP 09/07/2024 (Exact Date)   SpO2 99%   BMI 42.42 kg/m  BP Readings from Last 3 Encounters:  09/07/24 126/82  08/02/24 122/82  07/05/24 130/88   Wt Readings from Last 3 Encounters:  09/07/24 262 lb 12.8 oz (119.2 kg)  08/02/24 258 lb 9 oz (117.3 kg)  07/05/24 258 lb (117 kg)    Physical Exam  Vitals and nursing note reviewed.  Constitutional:      General: She is not in acute distress.    Appearance: Normal appearance.  HENT:     Head: Normocephalic.     Right Ear: Tympanic membrane, ear canal and external ear normal.     Left Ear: Tympanic membrane, ear canal and external ear normal.  Eyes:     Extraocular Movements: Extraocular movements intact.     Conjunctiva/sclera: Conjunctivae normal.     Pupils: Pupils are equal, round, and reactive to light.  Cardiovascular:     Rate and Rhythm: Normal rate and regular rhythm.     Heart sounds: Normal heart sounds.  Pulmonary:     Effort: Pulmonary effort is normal.     Breath sounds: Normal breath sounds.  Musculoskeletal:     Right lower leg: No edema.     Left lower leg: No edema.  Neurological:     General: No focal deficit present.     Mental Status: She is alert and oriented to person, place, and time.  Psychiatric:        Mood and Affect:  Mood normal.        Behavior: Behavior normal.        Thought Content: Thought content normal.        Judgment: Judgment normal.      No results found for any visits on 09/07/24.           [1]  Social History Tobacco Use   Smoking status: Former    Current packs/day: 0.00    Average packs/day: 0.3 packs/day for 11.7 years (3.0 ttl pk-yrs)    Types: Cigarettes    Start date: 09/05/1995    Quit date: 09/05/2015    Years since quitting: 9.0   Smokeless tobacco: Never   Tobacco comments:    No longer smoke  Vaping Use   Vaping status: Never Used  Substance Use Topics   Alcohol use: Not Currently    Comment: Every blue moon   Drug use: No  [2] No Known Allergies  "

## 2024-11-08 ENCOUNTER — Ambulatory Visit: Admitting: Family Medicine
